# Patient Record
Sex: Female | Born: 1955 | Race: White | Hispanic: No | Marital: Single | Smoking: Never smoker
Health system: Southern US, Community
[De-identification: ages and names within clinical notes are randomized; demographics above are authoritative.]

## PROBLEM LIST (undated history)

## (undated) DIAGNOSIS — F419 Anxiety disorder, unspecified: Secondary | ICD-10-CM

## (undated) DIAGNOSIS — G43909 Migraine, unspecified, not intractable, without status migrainosus: Secondary | ICD-10-CM

## (undated) DIAGNOSIS — F32A Depression, unspecified: Secondary | ICD-10-CM

## (undated) DIAGNOSIS — R053 Chronic cough: Secondary | ICD-10-CM

## (undated) HISTORY — PX: MULTIPLE TOOTH EXTRACTIONS: SHX2053

## (undated) HISTORY — PX: BACK SURGERY: SHX140

## (undated) HISTORY — PX: WISDOM TOOTH EXTRACTION: SHX21

## (undated) HISTORY — PX: HIP ARTHROPLASTY: SHX981

---

## 2021-03-08 ENCOUNTER — Other Ambulatory Visit: Payer: Self-pay | Admitting: Neurosurgery

## 2021-03-16 ENCOUNTER — Other Ambulatory Visit: Payer: Self-pay | Admitting: Neurosurgery

## 2021-03-19 NOTE — Pre-Procedure Instructions (Signed)
Surgical Instructions    Your procedure is scheduled on Thursday, February 23rd.  Report to [facility] Main Entrance "A" at 5:30 A.M., then check in with the Admitting office.  Call this number if you have problems the morning of surgery:  [phone]   If you have any questions prior to your surgery date call [phone]: Open Monday-Friday 8am-4pm    Remember:  Do not eat or drink after midnight the night before your surgery   Take these medicines the morning of surgery with A SIP OF WATER  ARIPiprazole (ABILIFY)  FLUoxetine (PROZAC)  gabapentin (NEURONTIN)  oxyCODONE-acetaminophen (PERCOCET/ROXICET)  topiramate (TOPAMAX)   As needed: clonazePAM (KLONOPIN) cyclobenzaprine (FLEXERIL)   As of today, STOP taking any Aspirin (unless otherwise instructed by your surgeon) Aleve, Naproxen, Ibuprofen, Motrin, Advil, Goody's, BC's, all herbal medications, fish oil, and all vitamins.                     Do NOT Smoke (Tobacco/Vaping) for 24 hours prior to your procedure.  If you use a CPAP at night, you may bring your mask/headgear for your overnight stay.   Contacts, glasses, piercing's, hearing aid's, dentures or partials may not be worn into surgery, please bring cases for these belongings.    For patients admitted to the hospital, discharge time will be determined by your treatment team.   Patients discharged the day of surgery will not be allowed to drive home, and someone needs to stay with them for 24 hours.  NO VISITORS WILL BE ALLOWED IN PRE-OP WHERE PATIENTS ARE PREPPED FOR SURGERY.  ONLY 1 SUPPORT PERSON MAY BE PRESENT IN THE WAITING ROOM WHILE YOU ARE IN SURGERY.  IF YOU ARE TO BE ADMITTED, ONCE YOU ARE IN YOUR ROOM YOU WILL BE ALLOWED TWO (2) VISITORS. (1) VISITOR MAY STAY OVERNIGHT BUT MUST ARRIVE TO THE ROOM BY 8pm.  Minor children may have two parents present. Special consideration for safety and communication needs will be reviewed on a case by case basis.   Special  instructions:   [place]- Preparing For Surgery  Before surgery, you can play an important role. Because skin is not sterile, your skin needs to be as free of germs as possible. You can reduce the number of germs on your skin by washing with CHG (chlorahexidine gluconate) Soap before surgery.  CHG is an antiseptic cleaner which kills germs and bonds with the skin to continue killing germs even after washing.    Oral Hygiene is also important to reduce your risk of infection.  Remember - BRUSH YOUR TEETH THE MORNING OF SURGERY WITH YOUR REGULAR TOOTHPASTE  Please do not use if you have an allergy to CHG or antibacterial soaps. If your skin becomes reddened/irritated stop using the CHG.  Do not shave (including legs and underarms) for at least 48 hours prior to first CHG shower. It is OK to shave your face.  Please follow these instructions carefully.   Shower the NIGHT BEFORE SURGERY and the MORNING OF SURGERY  If you chose to wash your hair, wash your hair first as usual with your normal shampoo.  After you shampoo, rinse your hair and body thoroughly to remove the shampoo.  Use CHG Soap as you would any other liquid soap. You can apply CHG directly to the skin and wash gently with a scrungie or a clean washcloth.   Apply the CHG Soap to your body ONLY FROM THE NECK DOWN.  Do not use on open  wounds or open sores. Avoid contact with your eyes, ears, mouth and genitals (private parts). Wash Face and genitals (private parts)  with your normal soap.   Wash thoroughly, paying special attention to the area where your surgery will be performed.  Thoroughly rinse your body with warm water from the neck down.  DO NOT shower/wash with your normal soap after using and rinsing off the CHG Soap.  Pat yourself dry with a CLEAN TOWEL.  Wear CLEAN PAJAMAS to bed the night before surgery  Place CLEAN SHEETS on your bed the night before your surgery  DO NOT SLEEP WITH PETS.   Day of  Surgery: Shower with CHG soap. Do not wear jewelry, make up, nail polish, gel polish, artificial nails, or any other type of covering on natural nails including finger and toenails. If patients have artificial nails, gel coating, etc. that need to be removed by a nail salon please have this removed prior to surgery. Surgery may need to be canceled/delayed if the surgeon/anesthesiologist feels like the patient is unable to be adequately monitored. Do not wear lotions, powders, perfumes, or deodorant. Do not shave 48 hours prior to surgery.  Do not bring valuables to the hospital. [facility] is not responsible for any belongings or valuables. Wear Clean/Comfortable clothing the morning of surgery Remember to brush your teeth WITH YOUR REGULAR TOOTHPASTE.   Please read over the following fact sheets that you were given.   3 days prior to your procedure or After your COVID test   You are not required to quarantine however you are required to wear a well-fitting mask when you are out and around people not in your household. If your mask becomes wet or soiled, replace with a new one.   Wash your hands often with soap and water for 20 seconds or clean your hands with an alcohol-based hand sanitizer that contains at least 60% alcohol.   Do not share personal items.   Notify your provider:  o if you are in close contact with someone who has COVID  o or if you develop a fever of 100.4 or greater, sneezing, cough, sore throat, shortness of breath or body aches.

## 2021-03-22 ENCOUNTER — Encounter (HOSPITAL_COMMUNITY): Payer: Self-pay

## 2021-03-22 ENCOUNTER — Encounter (HOSPITAL_COMMUNITY)
Admission: RE | Admit: 2021-03-22 | Discharge: 2021-03-22 | Disposition: A | Discharge status: Home / Self Care | Payer: Medicare PPO | Source: Ambulatory Visit | Attending: Neurosurgery | Admitting: Neurosurgery

## 2021-03-22 ENCOUNTER — Other Ambulatory Visit: Payer: Self-pay

## 2021-03-22 VITALS — BP 121/88 | HR 90 | Temp 98.5°F | Resp 17 | Ht 64.0 in | Wt 126.8 lb

## 2021-03-22 DIAGNOSIS — Z01812 Encounter for preprocedural laboratory examination: Secondary | ICD-10-CM | POA: Insufficient documentation

## 2021-03-22 DIAGNOSIS — Z20822 Contact with and (suspected) exposure to covid-19: Secondary | ICD-10-CM | POA: Insufficient documentation

## 2021-03-22 DIAGNOSIS — Z01818 Encounter for other preprocedural examination: Secondary | ICD-10-CM

## 2021-03-22 HISTORY — DX: Migraine, unspecified, not intractable, without status migrainosus: G43.909

## 2021-03-22 HISTORY — DX: Anxiety disorder, unspecified: F41.9

## 2021-03-22 HISTORY — DX: Depression, unspecified: F32.A

## 2021-03-22 HISTORY — DX: Chronic cough: R05.3

## 2021-03-22 LAB — SURGICAL PCR SCREEN
MRSA, PCR: NEGATIVE
Staphylococcus aureus: POSITIVE — AB

## 2021-03-22 LAB — CBC
HCT: 41.7 % (ref 36.0–46.0)
Hemoglobin: 13.5 g/dL (ref 12.0–15.0)
MCH: 31.3 pg (ref 26.0–34.0)
MCHC: 32.4 g/dL (ref 30.0–36.0)
MCV: 96.5 fL (ref 80.0–100.0)
Platelets: 334 10*3/uL (ref 150–400)
RBC: 4.32 MIL/uL (ref 3.87–5.11)
RDW: 13 % (ref 11.5–15.5)
WBC: 9.9 10*3/uL (ref 4.0–10.5)
nRBC: 0 % (ref 0.0–0.2)

## 2021-03-22 LAB — TYPE AND SCREEN
ABO/RH(D): AB POS
Antibody Screen: NEGATIVE

## 2021-03-22 NOTE — Progress Notes (Signed)
PCP - PA K. Lomax  Cardiologist - Denies  EP- Denies  Endocrine- Denies  Pulm- Denies  Chest x-ray - Denies  EKG - Denies  Stress Test - Denies  ECHO - Denies  Cardiac Cath - Denies  AICD- na PM- na LOOP- na  Nerve Stimulator- Denies  Dialysis- Denies  Sleep Study - Denies CPAP - Denies  LABS- 03/22/21: CBC, COVID, T/S, PCR  ASA- Denies  ERAS- No  HA1C- Denies  Anesthesia- No  Pt denies having chest pain, sob, or fever at this time. All instructions explained to the pt, with a verbal understanding of the material. Pt agrees to go over the instructions while at home for a better understanding. Pt also instructed to wear a mask and social distance if she goes out. after being tested for COVID-19. The opportunity to ask questions was provided.    Coronavirus Screening  Have you experienced the following symptoms:  Cough yes/no: No Fever (>100.49F)  yes/no: No Runny nose yes/no: No Sore throat yes/no: No Difficulty breathing/shortness of breath  yes/no: No  Have you or a family member traveled in the last 14 days and where? yes/no: No   If the patient indicates "YES" to the above questions, their PAT will be rescheduled to limit the exposure to others and, the surgeon will be notified. THE PATIENT WILL NEED TO BE ASYMPTOMATIC FOR 14 DAYS.   If the patient is not experiencing any of these symptoms, the PAT nurse will instruct them to NOT bring anyone with them to their appointment since they may have these symptoms or traveled as well.   Please remind your patients and families that hospital visitation restrictions are in effect and the importance of the restrictions.

## 2021-03-23 LAB — SARS CORONAVIRUS 2 (TAT 6-24 HRS): SARS Coronavirus 2: NEGATIVE

## 2021-03-24 NOTE — Anesthesia Preprocedure Evaluation (Addendum)
Anesthesia Evaluation  Patient identified by MRN, date of birth, ID band Patient awake    Reviewed: Allergy & Precautions, NPO status , Patient's Chart, lab work & pertinent test results  Airway Mallampati: II  TM Distance: >3 FB Neck ROM: Full    Dental  (+) Dental Advisory Given, Poor Dentition, Chipped, Missing,    Pulmonary neg pulmonary ROS, Patient abstained from smoking.,    Pulmonary exam normal breath sounds clear to auscultation       Cardiovascular negative cardio ROS Normal cardiovascular exam Rhythm:Regular Rate:Normal     Neuro/Psych  Headaches, PSYCHIATRIC DISORDERS Anxiety Depression    GI/Hepatic negative GI ROS, Neg liver ROS,   Endo/Other  negative endocrine ROS  Renal/GU negative Renal ROS     Musculoskeletal negative musculoskeletal ROS (+)   Abdominal   Peds  Hematology negative hematology ROS (+)   Anesthesia Other Findings   Reproductive/Obstetrics                            Anesthesia Physical Anesthesia Plan  ASA: 2  Anesthesia Plan: General   Post-op Pain Management: Tylenol PO (pre-op)*, Gabapentin PO (pre-op)* and Ketamine IV*   Induction:   PONV Risk Score and Plan: 4 or greater and Ondansetron, Dexamethasone, Treatment may vary due to age or medical condition, Midazolam and Propofol infusion  Airway Management Planned: Oral ETT  Additional Equipment: Arterial line  Intra-op Plan:   Post-operative Plan: Extubation in OR  Informed Consent: I have reviewed the patients History and Physical, chart, labs and discussed the procedure including the risks, benefits and alternatives for the proposed anesthesia with the patient or authorized representative who has indicated his/her understanding and acceptance.     Dental advisory given  Plan Discussed with: CRNA  Anesthesia Plan Comments: (2 x PIV)      Anesthesia Quick Evaluation

## 2021-03-25 ENCOUNTER — Encounter (HOSPITAL_COMMUNITY): Payer: Self-pay

## 2021-03-25 ENCOUNTER — Other Ambulatory Visit: Payer: Self-pay

## 2021-03-25 ENCOUNTER — Inpatient Hospital Stay (HOSPITAL_COMMUNITY)
Admission: RE | Disposition: A | Discharge status: Home / Self Care | Payer: Self-pay | Source: Home / Self Care | Attending: Neurosurgery

## 2021-03-25 ENCOUNTER — Inpatient Hospital Stay (HOSPITAL_COMMUNITY): Payer: Medicare PPO | Admitting: Anesthesiology

## 2021-03-25 ENCOUNTER — Inpatient Hospital Stay (HOSPITAL_COMMUNITY): Payer: Medicare PPO

## 2021-03-25 ENCOUNTER — Inpatient Hospital Stay (HOSPITAL_COMMUNITY)
Admission: RE | Admit: 2021-03-25 | Discharge: 2021-03-27 | DRG: 455 | Disposition: A | Discharge status: Home / Self Care | Payer: Medicare PPO | Attending: Neurosurgery | Admitting: Neurosurgery

## 2021-03-25 DIAGNOSIS — Z419 Encounter for procedure for purposes other than remedying health state, unspecified: Secondary | ICD-10-CM

## 2021-03-25 DIAGNOSIS — F419 Anxiety disorder, unspecified: Secondary | ICD-10-CM | POA: Diagnosis present

## 2021-03-25 DIAGNOSIS — M48061 Spinal stenosis, lumbar region without neurogenic claudication: Principal | ICD-10-CM | POA: Diagnosis present

## 2021-03-25 DIAGNOSIS — M5136 Other intervertebral disc degeneration, lumbar region: Secondary | ICD-10-CM | POA: Diagnosis present

## 2021-03-25 DIAGNOSIS — F32A Depression, unspecified: Secondary | ICD-10-CM | POA: Diagnosis present

## 2021-03-25 DIAGNOSIS — Z79899 Other long term (current) drug therapy: Secondary | ICD-10-CM

## 2021-03-25 DIAGNOSIS — M532X6 Spinal instabilities, lumbar region: Secondary | ICD-10-CM | POA: Diagnosis present

## 2021-03-25 DIAGNOSIS — M419 Scoliosis, unspecified: Secondary | ICD-10-CM | POA: Diagnosis present

## 2021-03-25 DIAGNOSIS — M4316 Spondylolisthesis, lumbar region: Secondary | ICD-10-CM | POA: Diagnosis present

## 2021-03-25 DIAGNOSIS — Z20822 Contact with and (suspected) exposure to covid-19: Secondary | ICD-10-CM | POA: Diagnosis present

## 2021-03-25 DIAGNOSIS — Z96642 Presence of left artificial hip joint: Secondary | ICD-10-CM | POA: Diagnosis present

## 2021-03-25 DIAGNOSIS — Z79891 Long term (current) use of opiate analgesic: Secondary | ICD-10-CM | POA: Diagnosis not present

## 2021-03-25 DIAGNOSIS — Z981 Arthrodesis status: Secondary | ICD-10-CM | POA: Diagnosis not present

## 2021-03-25 DIAGNOSIS — M438X6 Other specified deforming dorsopathies, lumbar region: Secondary | ICD-10-CM | POA: Diagnosis present

## 2021-03-25 DIAGNOSIS — M4156 Other secondary scoliosis, lumbar region: Secondary | ICD-10-CM | POA: Diagnosis present

## 2021-03-25 HISTORY — PX: ANTERIOR LAT LUMBAR FUSION: SHX1168

## 2021-03-25 HISTORY — PX: LAMINECTOMY WITH POSTERIOR LATERAL ARTHRODESIS LEVEL 3: SHX6337

## 2021-03-25 LAB — ABO/RH: ABO/RH(D): AB POS

## 2021-03-25 SURGERY — ANTERIOR LATERAL LUMBAR FUSION 3 LEVELS
Anesthesia: General

## 2021-03-25 MED ORDER — MENTHOL 3 MG MT LOZG: 1.0000 | LOZENGE | OROMUCOSAL | Status: DC | PRN

## 2021-03-25 MED ORDER — DEXMEDETOMIDINE (PRECEDEX) IN NS 20 MCG/5ML (4 MCG/ML) IV SYRINGE
PREFILLED_SYRINGE | INTRAVENOUS | Status: DC | PRN
  Administered 2021-03-25: 8 ug via INTRAVENOUS
  Administered 2021-03-25 (×3): 4 ug via INTRAVENOUS

## 2021-03-25 MED ORDER — PROPOFOL 10 MG/ML IV BOLUS
INTRAVENOUS | Status: DC | PRN
  Administered 2021-03-25: 100 mg via INTRAVENOUS
  Administered 2021-03-25: 50 mg via INTRAVENOUS
  Administered 2021-03-25: 35 mg via INTRAVENOUS
  Administered 2021-03-25 (×4): 50 mg via INTRAVENOUS
  Administered 2021-03-25: 40 mg via INTRAVENOUS
  Administered 2021-03-25 (×4): 50 mg via INTRAVENOUS
  Administered 2021-03-25: 100 mg via INTRAVENOUS

## 2021-03-25 MED ORDER — DEXMEDETOMIDINE (PRECEDEX) IN NS 20 MCG/5ML (4 MCG/ML) IV SYRINGE
PREFILLED_SYRINGE | INTRAVENOUS | Status: AC
  Filled 2021-03-25: qty 5

## 2021-03-25 MED ORDER — ONDANSETRON HCL 4 MG/2ML IJ SOLN
INTRAMUSCULAR | Status: DC | PRN
  Administered 2021-03-25: 4 mg via INTRAVENOUS

## 2021-03-25 MED ORDER — OXYCODONE HCL 5 MG PO TABS
ORAL_TABLET | ORAL | Status: AC
  Filled 2021-03-25: qty 1

## 2021-03-25 MED ORDER — PSEUDOEPHEDRINE HCL ER 120 MG PO TB12: 120.0000 mg | ORAL_TABLET | Freq: Every day | ORAL | Status: DC | PRN

## 2021-03-25 MED ORDER — SODIUM CHLORIDE (PF) 0.9 % IJ SOLN
INTRAMUSCULAR | Status: DC | PRN
  Administered 2021-03-25: 10 mL via INTRAVENOUS

## 2021-03-25 MED ORDER — POLYETHYLENE GLYCOL 3350 17 G PO PACK: 17.0000 g | PACK | Freq: Every day | ORAL | Status: DC | PRN

## 2021-03-25 MED ORDER — POTASSIUM CHLORIDE IN NACL 20-0.9 MEQ/L-% IV SOLN: INTRAVENOUS | Status: DC

## 2021-03-25 MED ORDER — OXYCODONE HCL 5 MG PO TABS
10.0000 mg | ORAL_TABLET | ORAL | Status: DC | PRN
  Administered 2021-03-25 – 2021-03-26 (×5): 10 mg via ORAL
  Filled 2021-03-25 (×5): qty 2

## 2021-03-25 MED ORDER — VANCOMYCIN HCL 1000 MG IV SOLR
INTRAVENOUS | Status: DC | PRN
  Administered 2021-03-25: 1000 mg

## 2021-03-25 MED ORDER — GABAPENTIN 300 MG PO CAPS
300.0000 mg | ORAL_CAPSULE | Freq: Two times a day (BID) | ORAL | Status: DC
  Administered 2021-03-25 – 2021-03-27 (×4): 300 mg via ORAL
  Filled 2021-03-25 (×4): qty 1

## 2021-03-25 MED ORDER — OXYCODONE HCL 5 MG PO TABS
5.0000 mg | ORAL_TABLET | ORAL | Status: DC | PRN
  Administered 2021-03-25: 5 mg via ORAL

## 2021-03-25 MED ORDER — ROCURONIUM BROMIDE 10 MG/ML (PF) SYRINGE
PREFILLED_SYRINGE | INTRAVENOUS | Status: AC
  Filled 2021-03-25: qty 10

## 2021-03-25 MED ORDER — ACETAMINOPHEN 650 MG RE SUPP: 650.0000 mg | RECTAL | Status: DC | PRN

## 2021-03-25 MED ORDER — CEFAZOLIN SODIUM-DEXTROSE 2-4 GM/100ML-% IV SOLN
2.0000 g | INTRAVENOUS | Status: AC
  Administered 2021-03-25 (×2): 2 g via INTRAVENOUS
  Filled 2021-03-25: qty 100

## 2021-03-25 MED ORDER — EPHEDRINE 5 MG/ML INJ
INTRAVENOUS | Status: AC
  Filled 2021-03-25: qty 5

## 2021-03-25 MED ORDER — HYDROMORPHONE HCL 1 MG/ML IJ SOLN
INTRAMUSCULAR | Status: AC
  Filled 2021-03-25: qty 1

## 2021-03-25 MED ORDER — VITAMIN B-12 1000 MCG PO TABS
1000.0000 ug | ORAL_TABLET | Freq: Every day | ORAL | Status: DC
  Administered 2021-03-25 – 2021-03-27 (×2): 1000 ug via ORAL
  Filled 2021-03-25 (×2): qty 1

## 2021-03-25 MED ORDER — BUPIVACAINE HCL (PF) 0.5 % IJ SOLN
INTRAMUSCULAR | Status: DC | PRN
  Administered 2021-03-25: 5 mL
  Administered 2021-03-25: 20 mL
  Administered 2021-03-25: 5 mL

## 2021-03-25 MED ORDER — KETAMINE HCL 10 MG/ML IJ SOLN
INTRAMUSCULAR | Status: DC | PRN
  Administered 2021-03-25: 40 mg via INTRAVENOUS
  Administered 2021-03-25: 10 mg via INTRAVENOUS

## 2021-03-25 MED ORDER — LACTATED RINGERS IV SOLN: INTRAVENOUS | Status: DC | PRN

## 2021-03-25 MED ORDER — LIDOCAINE 2% (20 MG/ML) 5 ML SYRINGE
INTRAMUSCULAR | Status: AC
  Filled 2021-03-25: qty 5

## 2021-03-25 MED ORDER — ALBUMIN HUMAN 5 % IV SOLN: INTRAVENOUS | Status: DC | PRN

## 2021-03-25 MED ORDER — ACETAMINOPHEN 325 MG PO TABS
650.0000 mg | ORAL_TABLET | ORAL | Status: DC | PRN
  Administered 2021-03-25 – 2021-03-27 (×6): 650 mg via ORAL
  Filled 2021-03-25 (×6): qty 2

## 2021-03-25 MED ORDER — SODIUM CHLORIDE 0.9% FLUSH: 3.0000 mL | INTRAVENOUS | Status: DC | PRN

## 2021-03-25 MED ORDER — BUPIVACAINE HCL (PF) 0.5 % IJ SOLN
INTRAMUSCULAR | Status: AC
  Filled 2021-03-25: qty 30

## 2021-03-25 MED ORDER — BUPIVACAINE LIPOSOME 1.3 % IJ SUSP
INTRAMUSCULAR | Status: AC
  Filled 2021-03-25: qty 20

## 2021-03-25 MED ORDER — PROPOFOL 500 MG/50ML IV EMUL
INTRAVENOUS | Status: DC | PRN
  Administered 2021-03-25: 25 ug/kg/min via INTRAVENOUS
  Administered 2021-03-25: 50 ug/kg/min via INTRAVENOUS
  Administered 2021-03-25: 150 ug/kg/min via INTRAVENOUS

## 2021-03-25 MED ORDER — ONDANSETRON HCL 4 MG/2ML IJ SOLN
INTRAMUSCULAR | Status: AC
  Filled 2021-03-25: qty 2

## 2021-03-25 MED ORDER — ROCURONIUM BROMIDE 100 MG/10ML IV SOLN
INTRAVENOUS | Status: DC | PRN
  Administered 2021-03-25: 30 mg via INTRAVENOUS
  Administered 2021-03-25: 50 mg via INTRAVENOUS
  Administered 2021-03-25: 20 mg via INTRAVENOUS

## 2021-03-25 MED ORDER — ORAL CARE MOUTH RINSE: 15.0000 mL | Freq: Once | OROMUCOSAL | Status: AC

## 2021-03-25 MED ORDER — THROMBIN 5000 UNITS EX SOLR
CUTANEOUS | Status: AC
  Filled 2021-03-25: qty 5000

## 2021-03-25 MED ORDER — MIDAZOLAM HCL 2 MG/2ML IJ SOLN
INTRAMUSCULAR | Status: DC | PRN
  Administered 2021-03-25 (×2): 1 mg via INTRAVENOUS

## 2021-03-25 MED ORDER — ACETAMINOPHEN 500 MG PO TABS
1000.0000 mg | ORAL_TABLET | Freq: Once | ORAL | Status: AC
  Administered 2021-03-25: 1000 mg via ORAL
  Filled 2021-03-25: qty 2

## 2021-03-25 MED ORDER — CEFAZOLIN SODIUM-DEXTROSE 1-4 GM/50ML-% IV SOLN
1.0000 g | Freq: Three times a day (TID) | INTRAVENOUS | Status: DC
  Administered 2021-03-25 – 2021-03-26 (×2): 1 g via INTRAVENOUS
  Filled 2021-03-25: qty 50

## 2021-03-25 MED ORDER — HYDROMORPHONE HCL 1 MG/ML IJ SOLN
0.2500 mg | INTRAMUSCULAR | Status: DC | PRN
  Administered 2021-03-25 (×2): 0.5 mg via INTRAVENOUS
  Administered 2021-03-25: 0.25 mg via INTRAVENOUS
  Administered 2021-03-25: 0.5 mg via INTRAVENOUS
  Administered 2021-03-25: 0.25 mg via INTRAVENOUS

## 2021-03-25 MED ORDER — OYSTER SHELL CALCIUM/D3 500-5 MG-MCG PO TABS
ORAL_TABLET | Freq: Every day | ORAL | Status: DC
  Administered 2021-03-25 – 2021-03-27 (×2): 1 via ORAL
  Filled 2021-03-25 (×2): qty 1

## 2021-03-25 MED ORDER — 0.9 % SODIUM CHLORIDE (POUR BTL) OPTIME
TOPICAL | Status: DC | PRN
  Administered 2021-03-25 (×2): 1000 mL

## 2021-03-25 MED ORDER — FLUOXETINE HCL 20 MG PO CAPS
40.0000 mg | ORAL_CAPSULE | Freq: Every day | ORAL | Status: DC
  Administered 2021-03-25: 40 mg via ORAL
  Filled 2021-03-25 (×2): qty 2

## 2021-03-25 MED ORDER — DEXAMETHASONE SODIUM PHOSPHATE 10 MG/ML IJ SOLN
INTRAMUSCULAR | Status: AC
  Filled 2021-03-25: qty 1

## 2021-03-25 MED ORDER — MEPERIDINE HCL 25 MG/ML IJ SOLN: 6.2500 mg | INTRAMUSCULAR | Status: DC | PRN

## 2021-03-25 MED ORDER — POTASSIUM 99 MG PO TABS: 99.0000 mg | ORAL_TABLET | Freq: Every day | ORAL | Status: DC

## 2021-03-25 MED ORDER — LIDOCAINE-EPINEPHRINE 1 %-1:100000 IJ SOLN
INTRAMUSCULAR | Status: AC
  Filled 2021-03-25: qty 1

## 2021-03-25 MED ORDER — ENOXAPARIN SODIUM 40 MG/0.4ML IJ SOSY
40.0000 mg | PREFILLED_SYRINGE | INTRAMUSCULAR | Status: DC
  Administered 2021-03-26 – 2021-03-27 (×2): 40 mg via SUBCUTANEOUS
  Filled 2021-03-25 (×2): qty 0.4

## 2021-03-25 MED ORDER — CLONAZEPAM 0.5 MG PO TABS
0.5000 mg | ORAL_TABLET | Freq: Two times a day (BID) | ORAL | Status: DC | PRN
  Administered 2021-03-25 – 2021-03-26 (×2): 0.5 mg via ORAL
  Filled 2021-03-25 (×2): qty 1

## 2021-03-25 MED ORDER — ONDANSETRON HCL 4 MG PO TABS: 4.0000 mg | ORAL_TABLET | Freq: Four times a day (QID) | ORAL | Status: DC | PRN

## 2021-03-25 MED ORDER — BUPIVACAINE LIPOSOME 1.3 % IJ SUSP
INTRAMUSCULAR | Status: DC | PRN
  Administered 2021-03-25: 20 mL

## 2021-03-25 MED ORDER — HYDROMORPHONE HCL 1 MG/ML IJ SOLN
INTRAMUSCULAR | Status: DC | PRN
Start: 2021-03-25 — End: 2021-03-25
  Administered 2021-03-25: .5 mg via INTRAVENOUS

## 2021-03-25 MED ORDER — LACTATED RINGERS IV SOLN: INTRAVENOUS | Status: DC

## 2021-03-25 MED ORDER — PROMETHAZINE HCL 25 MG/ML IJ SOLN: 6.2500 mg | INTRAMUSCULAR | Status: DC | PRN

## 2021-03-25 MED ORDER — CHLORHEXIDINE GLUCONATE 0.12 % MT SOLN
15.0000 mL | Freq: Once | OROMUCOSAL | Status: AC
  Administered 2021-03-25: 15 mL via OROMUCOSAL
  Filled 2021-03-25: qty 15

## 2021-03-25 MED ORDER — PROPOFOL 1000 MG/100ML IV EMUL
INTRAVENOUS | Status: AC
  Filled 2021-03-25: qty 100

## 2021-03-25 MED ORDER — FENTANYL CITRATE (PF) 250 MCG/5ML IJ SOLN
INTRAMUSCULAR | Status: AC
  Filled 2021-03-25: qty 5

## 2021-03-25 MED ORDER — PROPOFOL 1000 MG/100ML IV EMUL
INTRAVENOUS | Status: AC
  Filled 2021-03-25: qty 300

## 2021-03-25 MED ORDER — EPHEDRINE SULFATE-NACL 50-0.9 MG/10ML-% IV SOSY
PREFILLED_SYRINGE | INTRAVENOUS | Status: DC | PRN
  Administered 2021-03-25: 10 mg via INTRAVENOUS

## 2021-03-25 MED ORDER — FENTANYL CITRATE (PF) 250 MCG/5ML IJ SOLN
INTRAMUSCULAR | Status: DC | PRN
  Administered 2021-03-25: 50 ug via INTRAVENOUS
  Administered 2021-03-25: 100 ug via INTRAVENOUS
  Administered 2021-03-25 (×2): 50 ug via INTRAVENOUS

## 2021-03-25 MED ORDER — SUCCINYLCHOLINE CHLORIDE 200 MG/10ML IV SOSY
PREFILLED_SYRINGE | INTRAVENOUS | Status: DC | PRN
  Administered 2021-03-25: 80 mg via INTRAVENOUS

## 2021-03-25 MED ORDER — DEXAMETHASONE SODIUM PHOSPHATE 10 MG/ML IJ SOLN
INTRAMUSCULAR | Status: DC | PRN
Start: 2021-03-25 — End: 2021-03-25
  Administered 2021-03-25: 5 mg via INTRAVENOUS

## 2021-03-25 MED ORDER — KETAMINE HCL 50 MG/5ML IJ SOSY
PREFILLED_SYRINGE | INTRAMUSCULAR | Status: AC
  Filled 2021-03-25: qty 5

## 2021-03-25 MED ORDER — SODIUM CHLORIDE 0.9% FLUSH
3.0000 mL | Freq: Two times a day (BID) | INTRAVENOUS | Status: DC
  Administered 2021-03-26: 3 mL via INTRAVENOUS

## 2021-03-25 MED ORDER — VANCOMYCIN HCL 1000 MG IV SOLR
INTRAVENOUS | Status: AC
  Filled 2021-03-25: qty 20

## 2021-03-25 MED ORDER — FLEET ENEMA 7-19 GM/118ML RE ENEM: 1.0000 | ENEMA | Freq: Once | RECTAL | Status: DC | PRN

## 2021-03-25 MED ORDER — THROMBIN 5000 UNITS EX SOLR
OROMUCOSAL | Status: DC | PRN
  Administered 2021-03-25 (×2): 5 mL via TOPICAL

## 2021-03-25 MED ORDER — CHLORHEXIDINE GLUCONATE CLOTH 2 % EX PADS: 6.0000 | MEDICATED_PAD | Freq: Once | CUTANEOUS | Status: DC

## 2021-03-25 MED ORDER — ARIPIPRAZOLE 5 MG PO TABS
5.0000 mg | ORAL_TABLET | Freq: Every day | ORAL | Status: DC
  Administered 2021-03-26 – 2021-03-27 (×2): 5 mg via ORAL
  Filled 2021-03-25 (×2): qty 1

## 2021-03-25 MED ORDER — DOCUSATE SODIUM 100 MG PO CAPS
100.0000 mg | ORAL_CAPSULE | Freq: Two times a day (BID) | ORAL | Status: DC
  Administered 2021-03-25 – 2021-03-27 (×4): 100 mg via ORAL
  Filled 2021-03-25 (×4): qty 1

## 2021-03-25 MED ORDER — PROPOFOL 10 MG/ML IV BOLUS
INTRAVENOUS | Status: AC
  Filled 2021-03-25: qty 20

## 2021-03-25 MED ORDER — SUCCINYLCHOLINE CHLORIDE 200 MG/10ML IV SOSY
PREFILLED_SYRINGE | INTRAVENOUS | Status: AC
  Filled 2021-03-25: qty 10

## 2021-03-25 MED ORDER — ONDANSETRON HCL 4 MG/2ML IJ SOLN: 4.0000 mg | Freq: Four times a day (QID) | INTRAMUSCULAR | Status: DC | PRN

## 2021-03-25 MED ORDER — PHENYLEPHRINE 40 MCG/ML (10ML) SYRINGE FOR IV PUSH (FOR BLOOD PRESSURE SUPPORT)
PREFILLED_SYRINGE | INTRAVENOUS | Status: DC | PRN
  Administered 2021-03-25 (×3): 40 ug via INTRAVENOUS

## 2021-03-25 MED ORDER — LIDOCAINE-EPINEPHRINE 1 %-1:100000 IJ SOLN
INTRAMUSCULAR | Status: DC | PRN
  Administered 2021-03-25 (×2): 5 mL

## 2021-03-25 MED ORDER — MAGNESIUM GLUCONATE 500 MG PO TABS
500.0000 mg | ORAL_TABLET | Freq: Every day | ORAL | Status: DC
Start: 2021-03-25 — End: 2021-03-27
  Administered 2021-03-26: 500 mg via ORAL
  Filled 2021-03-25 (×3): qty 1

## 2021-03-25 MED ORDER — FERROUS SULFATE 325 (65 FE) MG PO TABS
325.0000 mg | ORAL_TABLET | Freq: Every day | ORAL | Status: DC
  Administered 2021-03-26 – 2021-03-27 (×2): 325 mg via ORAL
  Filled 2021-03-25 (×2): qty 1

## 2021-03-25 MED ORDER — PHENYLEPHRINE HCL-NACL 20-0.9 MG/250ML-% IV SOLN
INTRAVENOUS | Status: DC | PRN
  Administered 2021-03-25: 50 ug/min via INTRAVENOUS

## 2021-03-25 MED ORDER — LIDOCAINE 2% (20 MG/ML) 5 ML SYRINGE
INTRAMUSCULAR | Status: DC | PRN
Start: 2021-03-25 — End: 2021-03-25
  Administered 2021-03-25: 60 mg via INTRAVENOUS

## 2021-03-25 MED ORDER — FLUOXETINE HCL 20 MG PO CAPS
20.0000 mg | ORAL_CAPSULE | Freq: Every morning | ORAL | Status: DC
  Administered 2021-03-26 – 2021-03-27 (×2): 20 mg via ORAL
  Filled 2021-03-25 (×2): qty 1

## 2021-03-25 MED ORDER — SODIUM CHLORIDE 0.9 % IV SOLN: 250.0000 mL | INTRAVENOUS | Status: DC

## 2021-03-25 MED ORDER — TOPIRAMATE 25 MG PO TABS
50.0000 mg | ORAL_TABLET | Freq: Two times a day (BID) | ORAL | Status: DC
  Administered 2021-03-25 – 2021-03-27 (×4): 50 mg via ORAL
  Filled 2021-03-25 (×4): qty 2

## 2021-03-25 MED ORDER — MIDAZOLAM HCL 2 MG/2ML IJ SOLN
INTRAMUSCULAR | Status: AC
  Filled 2021-03-25: qty 2

## 2021-03-25 MED ORDER — HYDROMORPHONE HCL 1 MG/ML IJ SOLN
INTRAMUSCULAR | Status: AC
  Filled 2021-03-25: qty 0.5

## 2021-03-25 MED ORDER — BISACODYL 5 MG PO TBEC: 5.0000 mg | DELAYED_RELEASE_TABLET | Freq: Every day | ORAL | Status: DC | PRN

## 2021-03-25 MED ORDER — PHENOL 1.4 % MT LIQD: 1.0000 | OROMUCOSAL | Status: DC | PRN

## 2021-03-25 MED ORDER — SUGAMMADEX SODIUM 200 MG/2ML IV SOLN
INTRAVENOUS | Status: DC | PRN
  Administered 2021-03-25: 200 mg via INTRAVENOUS

## 2021-03-25 MED ORDER — CYCLOBENZAPRINE HCL 10 MG PO TABS
10.0000 mg | ORAL_TABLET | Freq: Three times a day (TID) | ORAL | Status: DC | PRN
  Filled 2021-03-25: qty 1

## 2021-03-25 MED ORDER — PHENYLEPHRINE 40 MCG/ML (10ML) SYRINGE FOR IV PUSH (FOR BLOOD PRESSURE SUPPORT)
PREFILLED_SYRINGE | INTRAVENOUS | Status: AC
  Filled 2021-03-25: qty 10

## 2021-03-25 MED ORDER — HYDROMORPHONE HCL 1 MG/ML IJ SOLN
0.5000 mg | INTRAMUSCULAR | Status: DC | PRN
  Administered 2021-03-25 (×2): 0.5 mg via INTRAVENOUS
  Filled 2021-03-25 (×2): qty 0.5

## 2021-03-25 MED ORDER — GABAPENTIN 300 MG PO CAPS
300.0000 mg | ORAL_CAPSULE | Freq: Once | ORAL | Status: DC
Start: 2021-03-25 — End: 2021-03-25
  Filled 2021-03-25: qty 1

## 2021-03-25 SURGICAL SUPPLY — 95 items
BAG COUNTER SPONGE SURGICOUNT (BAG) ×6 IMPLANT
BAND RUBBER #18 3X1/16 STRL (MISCELLANEOUS) ×2 IMPLANT
BLADE CLIPPER SURG (BLADE) IMPLANT
BLADE EXTENDER LTP N26 DISP (ORTHOPEDIC DISPOSABLE SUPPLIES) ×1 IMPLANT
BUR CARBIDE MATCH 3.0 (BURR) ×1 IMPLANT
BUR PRECISION FLUTE 5.0 (BURR) ×1 IMPLANT
CANISTER SUCT 3000ML PPV (MISCELLANEOUS) ×2 IMPLANT
CAP PUSHER PTP (ORTHOPEDIC DISPOSABLE SUPPLIES) ×1 IMPLANT
CLIP SPRING STIM LLIF SAFEOP (CLIP) ×1 IMPLANT
DECANTER SPIKE VIAL GLASS SM (MISCELLANEOUS) ×2 IMPLANT
DERMABOND ADHESIVE PROPEN (GAUZE/BANDAGES/DRESSINGS) ×1
DERMABOND ADVANCED (GAUZE/BANDAGES/DRESSINGS) ×1
DERMABOND ADVANCED .7 DNX12 (GAUZE/BANDAGES/DRESSINGS) ×1 IMPLANT
DERMABOND ADVANCED .7 DNX6 (GAUZE/BANDAGES/DRESSINGS) IMPLANT
DILATOR INSULATED SAFEOP OVAL (NEUROSURGERY SUPPLIES) ×1 IMPLANT
DISSECTOR BLUNT TIP ENDO 5MM (MISCELLANEOUS) IMPLANT
DRAIN JACKSON PRATT 10MM FLAT (MISCELLANEOUS) ×2 IMPLANT
DRAPE C-ARM 42X72 X-RAY (DRAPES) ×5 IMPLANT
DRAPE C-ARMOR (DRAPES) ×3 IMPLANT
DRAPE LAPAROTOMY 100X72X124 (DRAPES) ×4 IMPLANT
DRAPE SURG 17X23 STRL (DRAPES) ×1 IMPLANT
DRSG OPSITE POSTOP 4X10 (GAUZE/BANDAGES/DRESSINGS) ×1 IMPLANT
DRSG OPSITE POSTOP 4X6 (GAUZE/BANDAGES/DRESSINGS) ×1 IMPLANT
DURAPREP 26ML APPLICATOR (WOUND CARE) ×4 IMPLANT
ELECT BLADE INSULATED 6.5IN (ELECTROSURGICAL) ×4
ELECT KIT SAFEOP SSEP/SURF (KITS) ×2
ELECT REM PT RETURN 9FT ADLT (ELECTROSURGICAL) ×4
ELECTRODE BLDE INSULATED 6.5IN (ELECTROSURGICAL) ×1 IMPLANT
ELECTRODE KT SAFEOP SSEP/SURF (KITS) IMPLANT
ELECTRODE REM PT RTRN 9FT ADLT (ELECTROSURGICAL) ×2 IMPLANT
EVACUATOR SILICONE 100CC (DRAIN) ×2 IMPLANT
GAUZE 4X4 16PLY ~~LOC~~+RFID DBL (SPONGE) ×3 IMPLANT
GAUZE SPONGE 4X4 12PLY STRL (GAUZE/BANDAGES/DRESSINGS) ×2 IMPLANT
GLOVE EXAM NITRILE XL STR (GLOVE) IMPLANT
GLOVE SURG LTX SZ7.5 (GLOVE) ×5 IMPLANT
GLOVE SURG UNDER POLY LF SZ7 (GLOVE) ×5 IMPLANT
GLOVE SURG UNDER POLY LF SZ7.5 (GLOVE) ×6 IMPLANT
GOWN STRL REUS W/ TWL LRG LVL3 (GOWN DISPOSABLE) ×2 IMPLANT
GOWN STRL REUS W/ TWL XL LVL3 (GOWN DISPOSABLE) ×3 IMPLANT
GOWN STRL REUS W/TWL 2XL LVL3 (GOWN DISPOSABLE) IMPLANT
GOWN STRL REUS W/TWL LRG LVL3 (GOWN DISPOSABLE) ×4
GOWN STRL REUS W/TWL XL LVL3 (GOWN DISPOSABLE) ×5
GRAFT BN 10X1XDBM MAGNIFUSE (Bone Implant) IMPLANT
GRAFT BONE MAGNIFUSE 1X10CM (Bone Implant) ×1 IMPLANT
GUIDEWIRE LLIF TT 320 (WIRE) ×1 IMPLANT
HANDLE T POWER 1/4 SQUARE (MISCELLANEOUS) ×1 IMPLANT
HEMOSTAT POWDER KIT SURGIFOAM (HEMOSTASIS) ×3 IMPLANT
KIT BASIN OR (CUSTOM PROCEDURE TRAY) ×4 IMPLANT
KIT INFUSE MEDIUM (Orthopedic Implant) ×1 IMPLANT
KIT TURNOVER KIT B (KITS) ×4 IMPLANT
KNIFE ANNULOTOMY GREY RETRACT (ORTHOPEDIC DISPOSABLE SUPPLIES) ×1 IMPLANT
LIF ILLUMINATION SYSTEM STERIL (SYSTAGENIX WOUND MANAGEMENT) ×2
MATRIX SPINE STRIP NEOCORE 5CC (Putty) IMPLANT
MATRIX STRIP NEOCORE 12C (Putty) IMPLANT
NDL HYPO 18GX1.5 BLUNT FILL (NEEDLE) IMPLANT
NDL HYPO 21X1.5 SAFETY (NEEDLE) IMPLANT
NEEDLE HYPO 18GX1.5 BLUNT FILL (NEEDLE) IMPLANT
NEEDLE HYPO 21X1.5 SAFETY (NEEDLE) ×2 IMPLANT
NEEDLE HYPO 22GX1.5 SAFETY (NEEDLE) ×4 IMPLANT
NS IRRIG 1000ML POUR BTL (IV SOLUTION) ×4 IMPLANT
PACK LAMINECTOMY NEURO (CUSTOM PROCEDURE TRAY) ×4 IMPLANT
PAD ARMBOARD 7.5X6 YLW CONV (MISCELLANEOUS) ×6 IMPLANT
PROBE BALL TIP LLIF SAFEOP (NEUROSURGERY SUPPLIES) ×1 IMPLANT
PUTTY DBF 6CC CORTICAL FIBERS (Putty) ×1 IMPLANT
ROD KODIAK 5.5X300 (Rod) ×2 IMPLANT
SCREW CANC SHANK MOD 5X50 (Screw) ×1 IMPLANT
SCREW CORT PA 5.5X50 (Screw) ×3 IMPLANT
SCREW PA CANC COR 7.5X55 (Screw) ×2 IMPLANT
SCREW PA CANC COR 8.5X45 (Screw) ×2 IMPLANT
SCREW PA CANC COR 8.5X55 (Screw) ×4 IMPLANT
SCREW POLYAXIAL TULIP (Screw) ×1 IMPLANT
SET SCREW (Screw) ×12 IMPLANT
SET SCREW SPNE (Screw) IMPLANT
SHIM INTRADISCAL LTP N DISP (ORTHOPEDIC DISPOSABLE SUPPLIES) ×1 IMPLANT
SPACER IDENT 8X18X50 0D (Spacer) IMPLANT
SPACER IDENTITI 6X22X50 10D (Spacer) ×1 IMPLANT
SPACER IDENTITI 8X18X50 0D (Spacer) ×1 IMPLANT
SPACER IDENTITI 8X22X50 0D (Spacer) ×1 IMPLANT
SPONGE SURGIFOAM ABS GEL SZ50 (HEMOSTASIS) IMPLANT
SPONGE T-LAP 4X18 ~~LOC~~+RFID (SPONGE) ×2 IMPLANT
SPONGE TONSIL TAPE 1 RFD (DISPOSABLE) IMPLANT
STAPLER VISISTAT 35W (STAPLE) ×2 IMPLANT
STRIP MATRIX NEOCORE 12CC (Putty) ×1 IMPLANT
STRIP MATRIX NEOCORE 5CC (Putty) ×1 IMPLANT
SUT MNCRL AB 4-0 PS2 18 (SUTURE) ×4 IMPLANT
SUT VIC AB 0 CT1 18XCR BRD8 (SUTURE) ×1 IMPLANT
SUT VIC AB 0 CT1 8-18 (SUTURE) ×5
SUT VIC AB 2-0 CP2 18 (SUTURE) ×7 IMPLANT
SYR 20CC LL (SYRINGE) ×1 IMPLANT
SYSTEM ILLUMINATION LIF STERIL (SYSTAGENIX WOUND MANAGEMENT) IMPLANT
TOWEL GREEN STERILE (TOWEL DISPOSABLE) ×4 IMPLANT
TOWEL GREEN STERILE FF (TOWEL DISPOSABLE) ×4 IMPLANT
TRAY FOLEY MTR SLVR 14FR STAT (SET/KITS/TRAYS/PACK) ×1 IMPLANT
TRAY FOLEY MTR SLVR 16FR STAT (SET/KITS/TRAYS/PACK) ×1 IMPLANT
WATER STERILE IRR 1000ML POUR (IV SOLUTION) ×4 IMPLANT

## 2021-03-25 NOTE — Transfer of Care (Signed)
Immediate Anesthesia Transfer of Care Note  Patient: Linda Haley  Procedure(s) Performed: Direct Lateral Interbody Fusion  Lumbar one-two, Lumbar two-three, Lumbar three-four POSTERIOR LATERAL FUSION W/CONNECTION OF INSTRUMENTATION TO PREVIOUS LUMBAR FOUR-,SACRAL ONE, LUMBAR THREE-FOUR POSTERIOR DECOMPRESSION  Patient Location: PACU  Anesthesia Type:General  Level of Consciousness: drowsy  Airway & Oxygen Therapy: Patient Spontanous Breathing  Post-op Assessment: Report given to RN and Post -op Vital signs reviewed and stable  Post vital signs: Reviewed and stable  Last Vitals:  Vitals Value Taken Time  BP 147/85 03/25/21 1533  Temp    Pulse 86 03/25/21 1539  Resp 21 03/25/21 1539  SpO2 100 % 03/25/21 1539  Vitals shown include unvalidated device data.  Last Pain:  Vitals:   03/25/21 0611  TempSrc:   PainSc: 2          Complications: No notable events documented.

## 2021-03-25 NOTE — Op Note (Signed)
Procedure(s): Direct Lateral Interbody Fusion  Lumbar one-two, Lumbar two-three, Lumbar three-four POSTERIOR LATERAL FUSION W/CONNECTION OF INSTRUMENTATION TO PREVIOUS LUMBAR FOUR-,SACRAL ONE, LUMBAR THREE-FOUR POSTERIOR DECOMPRESSION Procedure Note  Linda Haley female 66 y.o. 03/25/2021  Procedure(s) and Anesthesia Type:    * Direct Lateral Interbody Fusion  Lumbar one-two, Lumbar two-three, Lumbar three-four - General    * POSTERIOR LATERAL FUSION W/CONNECTION OF INSTRUMENTATION TO PREVIOUS LUMBAR FOUR-,SACRAL ONE, LUMBAR THREE-FOUR POSTERIOR DECOMPRESSION - General  Surgeon(s) and Role:    Maisie Fus, Coy Saunas, MD - Primary    * Dawley, Alan Mulder, DO - Assisting   Indications: This is a 66 year old woman who had previously undergone L4-S1 posterior decompression and fusion and ALIFs in 2021 who developed progressive and severe back pain and left leg pain which was progressive despite nonsurgical therapies.  She had undergone physical therapy which made her symptoms worse and was on a fair amount of pain medication.  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  I had a long discussion with her regarding treatment options.  Given her symptoms were getting significantly worse, it was reasonable to consider extending her fusion and correcting her deformity to give her the best chance of functional recovery.  I discussed the general technique of surgery, as well as risk, benefits, terms, and expected convalescence.  Risks discussed included, but were not limited to, bleeding, pain, infection, scar, pseudoarthrosis, adjacent segment disease, neurologic deficit, spinal fluid leak, damage nearby organs, coma, and death.  Informed consent was obtained.  Surgeon: Bedelia Person   Assistants: Monia Pouch, DO. Please note, no  qualified trainees were available to assist with the procedure.  Assistance was required to expedite opening and closure for the posterior portion of the procedure.  Anesthesia: General endotracheal anesthesia  ASA Class: 2    Procedure Detail  Direct Lateral Interbody Fusion  Lumbar one-two, Lumbar two-three, Lumbar three-four, POSTERIOR LATERAL FUSION W/CONNECTION OF INSTRUMENTATION TO PREVIOUS LUMBAR FOUR-,SACRAL ONE, LUMBAR THREE-FOUR POSTERIOR DECOMPRESSION  L1-2, L2-3, L3-4 direct lateral interbody fusion L1-2, L2-3, L3-4 posterior arthrodesis Left L3-4 posterior decompression including facetectomies Removal of previous posterior instrumentation Segmental instrumentation with pedicle screw/rod construct L1-L2-L3-L4-L5-S1 Harvest of local autograft Use of morselized allograft and osteopromotive material   The patient brought to the op room.  General anesthesia was induced and patient was intubated by the anesthesia service.  After appropriate lines monitors were placed, patient was positioned in the lateral decubitus position with the right side up with all pressure points padded and eyes protected.  X-ray was performed to confirm true lateral positioning.  Slight break in the bed was used to gently open up the concavity of her coronal deformity on the right side.  The flank was preprepped with alcohol and prepped and draped in sterile fashion.  A timeout was performed.  Preoperative antibiotics were administered.  Baseline neuro monitoring was obtained.  The disc spaces were marked out over the skin.  There is already some correction of her coronal deformity with positioning.  A curvilinear incision was planned spanning the 3 disc spaces over the flank.  Subcutaneous tissue was divided.  The lateral muscle layers were opened bluntly and the transversalis fascia was opened to enter the retroperitoneal space.  Retroperitoneal dissection was performed with reflection of the peritoneal  contents anteriorly with finger dissection.  Initial dilator  was then docked on the L3-4 disc space under lateral x-ray guidance.  The initial dilator was stimulated and no nerves were in proximity.  The dilator was then passed through the psoas muscle and placed on the L3-4 interbody space.  There was a fair amount of lateral listhesis noted and osteophytic ridging.  Stimulation was performed and with no nearby nerves were confirmed, sequential dilators followed by stimulation was performed until the lateral retractor was placed and secured to the bed under x-ray guidance.  Visualization and stimulation of the field including the posterior retractor blade showed no nerves in proximity.  Shim was then placed in the disc space under AP guidance.  The dilator was then opened..  Remaining small amount of muscle was reflected off of the disc space.  The disc was opened with a 15 blade and disc was removed with pituitary rongeurs.  Cobb instrument was then passed under x-ray guidance to clear cartilage off of the endplates.  Box cutter and pituitary was used to remove remaining disc.  Rasps and curettes were used to prepare the endplates.  Trials were then used and appropriate sized interbody was selected.  The interbody was placed under x-ray guidance with good reduction of the lateral listhesis and spondylolisthesis and restoration of disc space height.  Meticulous hemostasis was obtained.  The shim was removed and the retractor was removed.  Attention was then turned to the L2-3 disc space. Initial dilator was then docked on the L2-3 disc space under lateral x-ray guidance.  The initial dilator was stimulated and no nerves were in proximity.  The dilator was then passed through the psoas muscle and placed on the L3-4 interbody space.  There was a fair amount of osteophytic ridging.  Stimulation was performed and with no nearby nerves were confirmed, sequential dilators followed by stimulation was performed until the  lateral retractor was placed and secured to the bed under x-ray guidance.  Visualization and stimulation of the field including the posterior retractor blade showed no nerves in proximity.  Shim was then placed in the disc space under AP guidance.  The dilator was then opened..  Remaining small amount of muscle was reflected off of the disc space.  The disc was opened with a 15 blade and disc was removed with pituitary rongeurs.  Cobb instrument was then passed under x-ray guidance to clear cartilage off of the endplates.  Box cutter and pituitary was used to remove remaining disc.  Rasps and curettes were used to prepare the endplates.  Trials were then used and appropriate sized interbody was selected.  The interbody was placed under x-ray guidance with good reduction of the lateral listhesis and spondylolisthesis and restoration of disc space height.  Meticulous hemostasis was obtained.  The shim was removed and the retractor was removed.  Attention was then turned to the L1-2 disc space. The 12th rib was pushed upward to allow access to the upper lumbar spine.  Initial dilator was docked over the L1-2 disc space under lateral x-ray guidance.  The initial dilator was stimulated and no nerves were in proximity.  The dilator was then passed through the psoas muscle and placed on the L3-4 interbody space.  There was a fair amount of osteophytic ridging.  Stimulation was performed and with no nearby nerves were confirmed, sequential dilators followed by stimulation was performed until the lateral retractor was placed and secured to the bed under x-ray guidance.  Visualization and stimulation of the field including the posterior retractor  blade showed no nerves in proximity.  Shim was then placed in the disc space under AP guidance.  The dilator was then opened..  Remaining small amount of muscle was reflected off of the disc space.  The disc was opened with a 15 blade and disc was removed with pituitary rongeurs.   Cobb instrument was then passed under x-ray guidance to clear cartilage off of the endplates.  Box cutter and pituitary was used to remove remaining disc.  Rasps and curettes were used to prepare the endplates.  Trials were then used and appropriate sized interbody was selected.  The interbody was placed under x-ray guidance with good reduction of the lateral listhesis and spondylolisthesis and restoration of disc space height.  Meticulous hemostasis was obtained.  The shim was removed and the retractor was removed.  There were no changes in neuro monitoring during this first portion of the procedure.  The wound was irrigated thoroughly and meticulous hemostasis was obtained.  Final x-rays were obtained.  The muscle fascia layer was closed with 0 Vicryl stitches.  The dermal layer was closed with 2-0 Vicryl stitches and the skin was closed with 4-0 Monocryl in subcuticular manner followed by Dermabond.  A sterile dressing was then placed.    Patient was then flipped prone onto the open McDermitt table with all pressure points padded and eyes protected.  The low back was preprepped with prepped and draped in sterile fashion.  A second timeout was performed.  Additional antibiotics were given.  The patient's previous incision was opened sharply with a 10 blade and the incision extended t superiorly.  The subcutaneous tissue and fascia were sharply opened.  Subperiosteal dissection was performed, exposing the L1, L2, L3 lamina as well as remaining superior L4 in subperiosteal fashion.  The transverse processes of L2, L3, exposed and decorticated.  Dense scar was opened sharply and dissected out laterally to expose the previous TSRH posterior instrumentation. Left L3 laminectomy and facetectomy was performed, with harvest of autograft.  Superior articulating facet was removed and the thecal sac and traversing nerve root was identified.  Overgrown superior articulating facet was removed with rongeurs, with good  decompression of the thecal sac and nerve root comfirmed with easy passage of ball ended nerve hook.   The TSRH rod nuts and rods were removed and screws were removed system.  Using x-ray guidance, fracture screws were placed through the same screw paths with good purchase.  Using x-ray guidance, pilot holes were placed for the pedicle screws and gearshift pedicle probe was used to cannulate the pedicles at each of these levels.  Feeler confirmed good bony channels.  The screw paths were tapped with pulmonary failure again confirming good bony channels.  Screws were then placed under x-ray guidance with good purchase.  160 mm rods were contoured, placed and final tightened with screw caps.  Final x-rays showed good instrumentation positioning.  The wound was irrigated thoroughly.  Additional decortication was performed of L1, L2, L3, and L4.  Autograft mixed with allograft placed in the lateral gutters bilaterally as well as over lamina.  Exparel was injected in the muscles.  Vancomycin powder was placed in the wound.  2 10 flat JP drains were placed in the subfascial space and tunneled out the skin and secured with a stitch.  The muscle layer was closed with 0 Vicryl stitches.  The fascia was closed with 0 Vicryl stitches.  The subcutaneous layer was closed with 2-0 Vicryl stitches.  The skin was  closed with 4-0 Monocryl in subcuticular manner followed by Dermabond and a sterile dressing.  Patient was then flipped supine and extubated by the anesthesia service.  All counts were correct at the end of surgery.  No complications were noted.   Findings: successful coronal correction of the lumbar deformity with interbody fusion and decompression  Estimated Blood Loss:  400 ml         Drains: JACKSON-PRATT (JP) x2          Total IV Fluids: see anesthesia records  Blood Given: none          Specimens:  Previous L4-S1 posterior TSRH hardware removed         Implants:  Alpha-Tec IdentiTi  Interbodies L1-2: 8x18x50 mm L2-3: 6PH, 10AH x 22 x 50, 10 degree lordosis L3-4: 8 x 22 x 50 Pedicle screws L1: 5.5x50 mm x 2 L2: 5.0 x 50 mm L, 5.5 x 50 mm R L3: 7.5 x 55 mm x 2 L4: 8.5 x 55 mm x 2 L5: 8.5 x 55 mm x 2 S1: 8.5 x 45 mm x 2 Titanium rods 160 mm rods x 2 Biologics 17 ml Neocore, medium Medtronic Infuse in interbodies 10 ml Magnifuse, 6 ml [place] in posterolateral gutters.  Complications:  * No complications entered in OR log *         Disposition: PACU - hemodynamically stable.         Condition: stable

## 2021-03-25 NOTE — Anesthesia Procedure Notes (Signed)
Arterial Line Insertion Start/End2/23/2023 7:15 AM, 03/25/2021 7:22 AM Performed by: Lewie Loron, MD, Waynard Edwards, CRNA, CRNA  Patient location: Pre-op. Preanesthetic checklist: patient identified, IV checked, site marked, risks and benefits discussed, surgical consent, monitors and equipment checked, pre-op evaluation, timeout performed and anesthesia consent Lidocaine 1% used for infiltration radial was placed Catheter size: 20 G Hand hygiene performed  and maximum sterile barriers used   Attempts: 1 Procedure performed without using ultrasound guided technique. Following insertion, dressing applied. Post procedure assessment: normal and unchanged  Patient tolerated the procedure well with no immediate complications.

## 2021-03-25 NOTE — H&P (Signed)
CC: back pain, right leg pain  HPI:     Patient is a 66 y.o. female presents with hx of L4-S1 ALIF, posterior fusion in 2021 developed worsening back and leg pain, worse on the left, with neurogenic claudication. 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 Nonsurgical therapies failed to help her pain.    There are no problems to display for this patient.  Past Medical History:  Diagnosis Date   Anxiety    Chronic cough    Smoker's cough   Depression    Migraine     Past Surgical History:  Procedure Laterality Date   BACK SURGERY     L4-5   CESAREAN SECTION     x1   HIP ARTHROPLASTY Left    MULTIPLE TOOTH EXTRACTIONS     WISDOM TOOTH EXTRACTION      Medications Prior to Admission  Medication Sig Dispense Refill Last Dose   ARIPiprazole (ABILIFY) 5 MG tablet Take 5 mg by mouth daily.   03/25/2021 at 0130   bisacodyl (DULCOLAX) 5 MG EC tablet Take 5 mg by mouth daily as needed for moderate constipation.   Past Week   Calcium Carb-Cholecalciferol (CALCIUM 600 + D PO) Take 1 tablet by mouth daily.   03/22/2021   clonazePAM (KLONOPIN) 1 MG tablet Take 0.5 mg by mouth 2 (two) times daily as needed for anxiety.   03/24/2021   cyclobenzaprine (FLEXERIL) 10 MG tablet Take 10 mg by mouth 3 (three) times daily as needed for muscle spasms.   03/25/2021 at 0130   ferrous sulfate 325 (65 FE) MG tablet Take 325 mg by mouth daily with breakfast.   03/22/2021   FLUoxetine (PROZAC) 20 MG capsule Take 20 mg by mouth in the morning.   03/25/2021   FLUoxetine (PROZAC) 40 MG capsule Take 40 mg by mouth daily in the afternoon.   03/25/2021   gabapentin (NEURONTIN) 300 MG capsule Take 300 mg by mouth in the morning and at bedtime.   03/25/2021   ibuprofen (ADVIL) 800 MG tablet Take 800 mg by mouth every 4 (four) hours as needed for moderate pain.   03/22/2021   magnesium gluconate (MAGONATE) 500 MG tablet Take 500 mg by mouth at bedtime.   03/22/2021    oxyCODONE-acetaminophen (PERCOCET/ROXICET) 5-325 MG tablet Take 2 tablets by mouth in the morning and at bedtime.   03/25/2021 at 0130   Potassium 99 MG TABS Take 99 mg by mouth daily.   03/22/2021   pseudoephedrine (SUDAFED) 120 MG 12 hr tablet Take 120 mg by mouth in the morning.   03/24/2021   topiramate (TOPAMAX) 50 MG tablet Take 50 mg by mouth 2 (two) times daily.   03/25/2021   vitamin B-12 (CYANOCOBALAMIN) 1000 MCG tablet Take 1,000 mcg by mouth daily.   03/22/2021   docusate sodium (COLACE) 100 MG capsule Take 200 mg by mouth at bedtime.   Unknown   No Known Allergies  Social History   Tobacco Use   Smoking status: Never   Smokeless tobacco: Never  Substance Use Topics   Alcohol use: Never    History reviewed. No pertinent family history.   Review of Systems Pertinent items are noted in HPI.  Objective:   Patient Vitals for the past 8 hrs:  BP Temp Temp src Pulse Resp SpO2  03/25/21 0542 120/74 97.8 F (36.6 C) Oral 81 17 100 %   No intake/output data recorded. No intake/output data recorded.  General : Alert, cooperative, no distress, appears stated age   Head:  Normocephalic/atraumatic    Eyes: PERRL, conjunctiva/corneas clear, EOM's intact. Fundi could not be visualized Neck: Supple Chest:  Respirations unlabored Chest wall: no tenderness or deformity Heart: Regular rate and rhythm Abdomen: Soft, nontender and nondistended Extremities: warm and well-perfused Skin: normal turgor, color and texture Neurologic:  Alert, oriented x 3.  Eyes open spontaneously. PERRL, EOMI, VFC, no facial droop. V1-3 intact.  No dysarthria, tongue protrusion symmetric.  CNII-XII intact. Normal strength, sensation and reflexes throughout.  No pronator drift, full strength in legs + SLR on left Well healed abdominal and back incision       Data Review  See clinic note for details  Assessment:   Active Problems:   * No active hospital problems. *  Severe lumbar  degenerative disease with stenosis, coronal deformity, instability  Plan:  - plan for L1-4 DLIFs, posterior fusion

## 2021-03-25 NOTE — Anesthesia Procedure Notes (Signed)
Procedure Name: Intubation Date/Time: 03/25/2021 8:15 AM Performed by: Nolon Nations, MD Pre-anesthesia Checklist: Patient identified, Emergency Drugs available, Suction available and Patient being monitored Patient Re-evaluated:Patient Re-evaluated prior to induction Oxygen Delivery Method: Circle system utilized Preoxygenation: Pre-oxygenation with 100% oxygen Induction Type: IV induction Ventilation: Mask ventilation without difficulty Laryngoscope Size: Mac and 4 Grade View: Grade I Tube type: Oral Tube size: 7.0 mm Number of attempts: 2 Airway Equipment and Method: Stylet and Oral airway Placement Confirmation: ETT inserted through vocal cords under direct vision, positive ETCO2 and breath sounds checked- equal and bilateral Secured at: 21 cm Tube secured with: Tape Dental Injury: Teeth and Oropharynx as per pre-operative assessment  Comments: Attempt by Raven x 1, paramedic student. Unable to visualize glottis. Nicholous Girgenti x 1, grade 1 view. Mac 3 likely better blade for patient but unavailable in room.

## 2021-03-25 NOTE — Progress Notes (Signed)
Orthopedic Tech Progress Note Patient Details:  Linda Haley 1955-08-13 [id number]  Patient ID: Linda Haley, female   DOB: 06-Apr-1955, 66 y.o.   MRN: [id number] Brace dropped off to patient. Al Decant 03/25/2021, 7:41 PM

## 2021-03-26 ENCOUNTER — Other Ambulatory Visit: Payer: Self-pay

## 2021-03-26 MED ORDER — OXYCODONE-ACETAMINOPHEN 5-325 MG PO TABS
1.0000 | ORAL_TABLET | ORAL | Status: DC | PRN
  Administered 2021-03-26 – 2021-03-27 (×3): 2 via ORAL
  Filled 2021-03-26 (×3): qty 2

## 2021-03-26 MED ORDER — CYCLOBENZAPRINE HCL 10 MG PO TABS
10.0000 mg | ORAL_TABLET | Freq: Three times a day (TID) | ORAL | Status: DC | PRN
  Administered 2021-03-26 – 2021-03-27 (×3): 10 mg via ORAL
  Filled 2021-03-26 (×3): qty 1

## 2021-03-26 MED ORDER — OXYCODONE HCL 5 MG PO TABS
5.0000 mg | ORAL_TABLET | ORAL | Status: DC | PRN
  Administered 2021-03-26 – 2021-03-27 (×3): 10 mg via ORAL
  Filled 2021-03-26 (×3): qty 2

## 2021-03-26 MED ORDER — HYDROMORPHONE HCL 1 MG/ML IJ SOLN: 0.5000 mg | INTRAMUSCULAR | Status: DC | PRN

## 2021-03-26 MED ORDER — METHOCARBAMOL 500 MG PO TABS
500.0000 mg | ORAL_TABLET | Freq: Four times a day (QID) | ORAL | Status: DC | PRN
  Administered 2021-03-26 (×2): 500 mg via ORAL
  Filled 2021-03-26 (×3): qty 1

## 2021-03-26 MED FILL — Thrombin For Soln 5000 Unit: CUTANEOUS | Qty: 5000 | Status: AC

## 2021-03-26 NOTE — Anesthesia Postprocedure Evaluation (Signed)
Anesthesia Post Note  Patient: Osiris Charles  Procedure(s) Performed: Direct Lateral Interbody Fusion  Lumbar one-two, Lumbar two-three, Lumbar three-four POSTERIOR LATERAL FUSION W/CONNECTION OF INSTRUMENTATION TO PREVIOUS LUMBAR FOUR-,SACRAL ONE, LUMBAR THREE-FOUR POSTERIOR DECOMPRESSION     Patient location during evaluation: PACU Anesthesia Type: General Level of consciousness: awake and alert Pain management: pain level controlled Vital Signs Assessment: post-procedure vital signs reviewed and stable Respiratory status: spontaneous breathing, nonlabored ventilation and respiratory function stable Cardiovascular status: blood pressure returned to baseline and stable Postop Assessment: no apparent nausea or vomiting Anesthetic complications: no   No notable events documented.  Last Vitals:  Vitals:   03/25/21 2306 03/26/21 0355  BP: (!) 115/59 114/68  Pulse: 94 88  Resp: 16 16  Temp: 37 C 37.1 C  SpO2: 97% 100%    Last Pain:  Vitals:   03/26/21 0556  TempSrc:   PainSc: 4                  Lowella Curb

## 2021-03-26 NOTE — Progress Notes (Signed)
Subjective: Patient reports that she is doing well overall. She has no pain or paraesthesia in her BLE. She is having 7/10 pain at her incisional region. She has ambulated well in the hall with nursing staff. No acute events overnight.   Objective: Vital signs in last 24 hours: Temp:  [97.7 F (36.5 C)-99.3 F (37.4 C)] 99.3 F (37.4 C) (02/24 0740) Pulse Rate:  [81-94] 89 (02/24 0740) Resp:  [12-21] 16 (02/24 0740) BP: (109-161)/(59-89) 109/60 (02/24 0740) SpO2:  [87 %-100 %] 99 % (02/24 0740) Arterial Line BP: (156-168)/(67-79) 161/67 (02/23 1605)  Intake/Output from previous day: 02/23 0701 - 02/24 0700 In: 3400 [I.V.:2700; IV Piggyback:700] Out: 2230 [Urine:1525; Drains:505; Blood:200] Intake/Output this shift: No intake/output data recorded.  Physical Exam: Patient is awake, A/O X 4, conversant, and in good spirits. They are in NAD and VSS. Doing well. Speech is fluent and appropriate. MAEW with good strength that is symmetric bilaterally. 5/5 BUE/BLE. Sensation to light touch is intact. PERLA, EOMI. CNs grossly intact. Dressing is clean dry intact. Incision is well approximated with no drainage, erythema, or edema.  Top JP drain with 160 ml of sanguineous  drainage. Bottom JP drain with 90 ml of sanguineous  drainage.  Lab Results: No results for input(s): WBC, HGB, HCT, PLT in the last 72 hours. BMET No results for input(s): NA, K, CL, CO2, GLUCOSE, BUN, CREATININE, CALCIUM in the last 72 hours.  Studies/Results: DG Lumbar Spine Complete  Result Date: 03/25/2021 CLINICAL DATA:  Multiple intraoperative fluoroscopic image EXAM: LUMBAR SPINE - COMPLETE 4+ VIEW COMPARISON:  Radiographs dated March 01, 2021 FINDINGS: Intraoperative utilization of fluoroscopy for L1-L4 XLIF. Total fluoroscopic time was 4 minutes and 21 seconds. Total fluoroscopic dose was 97.74 mGy. IMPRESSION: Intraoperative utilization of fluoroscopy Electronically Signed   By: Larose Hires D.O.   On:  03/25/2021 15:21   DG C-Arm 1-60 Min-No Report  Result Date: 03/25/2021 Fluoroscopy was utilized by the requesting physician.  No radiographic interpretation.   DG C-Arm 1-60 Min-No Report  Result Date: 03/25/2021 Fluoroscopy was utilized by the requesting physician.  No radiographic interpretation.   DG C-Arm 1-60 Min-No Report  Result Date: 03/25/2021 Fluoroscopy was utilized by the requesting physician.  No radiographic interpretation.   DG C-Arm 1-60 Min-No Report  Result Date: 03/25/2021 Fluoroscopy was utilized by the requesting physician.  No radiographic interpretation.   DG C-Arm 1-60 Min-No Report  Result Date: 03/25/2021 Fluoroscopy was utilized by the requesting physician.  No radiographic interpretation.   DG C-Arm 1-60 Min-No Report  Result Date: 03/25/2021 Fluoroscopy was utilized by the requesting physician.  No radiographic interpretation.   DG C-Arm 1-60 Min-No Report  Result Date: 03/25/2021 Fluoroscopy was utilized by the requesting physician.  No radiographic interpretation.    Assessment/Plan: Patient is post-op day 1 s/p L1-4 DLIFs and posterior fusion. She is recovering well and reports a resolution of her lower extremity symptoms.  Her only complaint is of incisional pain.  She has ambulated with nursing staff and is awaiting PT/OT evaluation.  Continue LSO brace when OOB. Continue working on pain control, mobility and ambulating patient. Will assess readiness for discharge this afternoon after her JP drains are removed, her pain is under better control, and she has worked with therapies.    LOS: 1 day     Council Mechanic, DNP, NP-C 03/26/2021, 8:04 AM

## 2021-03-26 NOTE — Evaluation (Signed)
Occupational Therapy Evaluation Patient Details Name: Linda Haley MRN: [id number] DOB: May 29, 1955 Today's Date: 03/26/2021   History of Present Illness 66 y/o female admitted on 03/25/21 following DLIF L1-4 with connection to previous PLIF L4-S1 with L3-4 posterior decompression. No significant PMH.   Clinical Impression   Chart reviewed, pt greeted in bedside chair, requesting to return to bed due to 10/10 reported pain. Rn notified of pt pain level, pt still in agreement for participation in evaluation. TLSO donned in sitting with good awareness noted from pt on donning/doffing device. Pt performed LB dressing ADLs with MIN A with hip kit, functional mobility with SUP-MOD I. Provided education on use of 3 in 1 over toilet, pt reports she has a tub transfer bench for use when she is cleared for showering. Additional education provided re: ADL completion with precautions, bed mobility, falls prevention, home safety. Pt is left in bed, all needs met. Continues to report pain, RN aware. No further OT needs identified at this time. Please re-consult if there is a change in functional status.      Recommendations for follow up therapy are one component of a multi-disciplinary discharge planning process, led by the attending physician.  Recommendations may be updated based on patient status, additional functional criteria and insurance authorization.   Follow Up Recommendations  No OT follow up    Assistance Recommended at Discharge PRN  Patient can return home with the following Help with stairs or ramp for entrance;A little help with bathing/dressing/bathroom    Functional Status Assessment  Patient has had a recent decline in their functional status and demonstrates the ability to make significant improvements in function in a reasonable and predictable amount of time.  Equipment Recommendations  BSC/3in1    Recommendations for Other Services       Precautions / Restrictions  Precautions Precautions: Back Precaution Booklet Issued: Yes (comment) Required Braces or Orthoses: Spinal Brace Spinal Brace: Thoracolumbosacral orthotic;Applied in sitting position Restrictions Weight Bearing Restrictions: No      Mobility Bed Mobility Overal bed mobility: Needs Assistance Bed Mobility: Sit to Sidelying         Sit to sidelying: Modified independent (Device/Increase time)      Transfers Overall transfer level: Modified independent Equipment used: Rolling walker (2 wheels)               General transfer comment: increased time      Balance Overall balance assessment: Mild deficits observed, not formally tested                                         ADL either performed or assessed with clinical judgement   ADL Overall ADL's : Needs assistance/impaired Eating/Feeding: Independent   Grooming: Wash/dry hands;Wash/dry face;Sitting;Standing;Supervision/safety Grooming Details (indicate cue type and reason): anticipated             Lower Body Dressing: Minimal assistance;Cueing for back precautions;Sit to/from stand Lower Body Dressing Details (indicate cue type and reason): with AE Toilet Transfer: Supervision/safety;Rolling walker (2 wheels);Ambulation Toilet Transfer Details (indicate cue type and reason): simulated         Functional mobility during ADLs: Supervision/safety;Rolling walker (2 wheels) General ADL Comments: limited by pain, however good mobility noted with approrpiate safety awareness     Vision   Vision Assessment?: No apparent visual deficits     Perception     Praxis  Pertinent Vitals/Pain Pain Assessment Pain Assessment: 0-10 Pain Score: 10-Worst pain ever Pain Location: back Pain Descriptors / Indicators: Operative site guarding, Discomfort, Crying Pain Intervention(s): Monitored during session, Limited activity within patient's tolerance, Patient requesting pain meds-RN notified      Hand Dominance     Extremity/Trunk Assessment Upper Extremity Assessment Upper Extremity Assessment: Overall WFL for tasks assessed   Lower Extremity Assessment Lower Extremity Assessment: Generalized weakness   Cervical / Trunk Assessment Cervical / Trunk Assessment: Back Surgery   Communication Communication Communication: No difficulties   Cognition Arousal/Alertness: Awake/alert Behavior During Therapy: WFL for tasks assessed/performed Overall Cognitive Status: Within Functional Limits for tasks assessed                                       General Comments       Exercises     Shoulder Instructions      Home Living Family/patient expects to be discharged to:: Private residence Living Arrangements: Other relatives;Other (Comment) (sister) Available Help at Discharge: Family;Available 24 hours/day Type of Home: House Home Access: Stairs to enter [employer] of Steps: 2   Home Layout: One level     Bathroom Shower/Tub: Teacher, early years/pre: Standard     Home Equipment: Conservation officer, nature (2 wheels);Tub bench   Additional Comments: staying with sister in [place] as long as needed following surgery      Prior Functioning/Environment Prior Level of Function : Independent/Modified Independent;Driving                        OT Problem List:        OT Treatment/Interventions:      OT Goals(Current goals can be found in the care plan section) Acute Rehab OT Goals Patient Stated Goal: less pain OT Goal Formulation: With patient Time For Goal Achievement: 04/09/21 Potential to Achieve Goals: Good  OT Frequency:      Co-evaluation              AM-PAC OT "6 Clicks" Daily Activity     Outcome Measure Help from another person eating meals?: None Help from another person taking care of personal grooming?: None Help from another person toileting, which includes using toliet, bedpan, or urinal?:  None Help from another person bathing (including washing, rinsing, drying)?: None Help from another person to put on and taking off regular upper body clothing?: None Help from another person to put on and taking off regular lower body clothing?: None 6 Click Score: 24   End of Session Equipment Utilized During Treatment: Rolling walker (2 wheels) Nurse Communication: Mobility status  Activity Tolerance: Patient limited by pain Patient left: in bed;with call bell/phone within reach  OT Visit Diagnosis: Unsteadiness on feet (R26.81)                Time: 9983-3825 OT Time Calculation (min): 14 min Charges:  OT General Charges $OT Visit: 1 Visit OT Evaluation $OT Eval Low Complexity: 1 Low  Shanon Payor, OTD OTR/L  03/26/21, 10:09 AM

## 2021-03-26 NOTE — Evaluation (Signed)
Physical Therapy Evaluation & Discharge Patient Details Name: Linda Haley MRN: [id number] DOB: September 09, 1955 Today's Date: 03/26/2021  History of Present Illness  66 y/o female admitted on 03/25/21 following DLIF L1-4 with connection to previous PLIF L4-S1 with L3-4 posterior decompression. No significant PMH.  Clinical Impression  Patient admitted following above procedure. Patient is limited by pain but overall mobilizing well at modI level with use of Rw for support. Patient will have sister to assist for mobility at home as needed. Educated patient on back precautions, brace wear, and progressive walking program to assist with returning to independence without RW, patient verbalized understanding. Provided handout for reference. No further skilled PT needs identified acutely. No PT follow up recommended at this time.        Recommendations for follow up therapy are one component of a multi-disciplinary discharge planning process, led by the attending physician.  Recommendations may be updated based on patient status, additional functional criteria and insurance authorization.  Follow Up Recommendations No PT follow up    Assistance Recommended at Discharge PRN  Patient can return home with the following       Equipment Recommendations BSC/3in1  Recommendations for Other Services       Functional Status Assessment Patient has had a recent decline in their functional status and demonstrates the ability to make significant improvements in function in a reasonable and predictable amount of time.     Precautions / Restrictions Precautions Precautions: Back Precaution Booklet Issued: Yes (comment) Required Braces or Orthoses: Spinal Brace Spinal Brace: Thoracolumbosacral orthotic;Applied in sitting position Restrictions Weight Bearing Restrictions: No      Mobility  Bed Mobility               General bed mobility comments: up in recliner on arrival    Transfers Overall  transfer level: Modified independent Equipment used: Rolling Linda Haley (2 wheels)               General transfer comment: increased time to complete but no need for physical assist    Ambulation/Gait Ambulation/Gait assistance: Modified independent (Device/Increase time) Gait Distance (Feet): 400 Feet Assistive device: Rolling Linda Haley (2 wheels) Gait Pattern/deviations: Step-through pattern, Decreased stride length Gait velocity: decreased     General Gait Details: patient prefers use of Rw for comfort as she used RW after previous back surgery  Stairs Stairs: Yes Stairs assistance: Modified independent (Device/Increase time) Stair Management: One rail Right, Step to pattern, Forwards Number of Stairs: 1    Wheelchair Mobility    Modified Rankin (Stroke Patients Only)       Balance Overall balance assessment: Mild deficits observed, not formally tested                                           Pertinent Vitals/Pain Pain Assessment Pain Assessment: Faces Faces Pain Scale: Hurts even more Pain Location: back Pain Descriptors / Indicators: Operative site guarding, Discomfort Pain Intervention(s): Monitored during session    Home Living Family/patient expects to be discharged to:: Private residence Living Arrangements: Other relatives (sister) Available Help at Discharge: Family;Available 24 hours/day Type of Home: House Home Access: Stairs to enter   Linda Haley of Steps: 2   Home Layout: One level Home Equipment: Agricultural consultant (2 wheels);Tub bench Additional Comments: lives in [name] but is currently staying with sister in [name] so she has assistance following this surgery  Prior Function Prior Level of Function : Independent/Modified Independent;Driving                     Hand Dominance        Extremity/Trunk Assessment   Upper Extremity Assessment Upper Extremity Assessment: Defer to OT evaluation     Lower Extremity Assessment Lower Extremity Assessment: Generalized weakness    Cervical / Trunk Assessment Cervical / Trunk Assessment: Back Surgery  Communication   Communication: No difficulties  Cognition Arousal/Alertness: Awake/alert Behavior During Therapy: WFL for tasks assessed/performed Overall Cognitive Status: Within Functional Limits for tasks assessed                                          General Comments      Exercises     Assessment/Plan    PT Assessment Patient does not need any further PT services  PT Problem List         PT Treatment Interventions      PT Goals (Current goals can be found in the Care Plan section)  Acute Rehab PT Goals Patient Stated Goal: to go home PT Goal Formulation: All assessment and education complete, DC therapy    Frequency       Co-evaluation               AM-PAC PT "6 Clicks" Mobility  Outcome Measure Help needed turning from your back to your side while in a flat bed without using bedrails?: None Help needed moving from lying on your back to sitting on the side of a flat bed without using bedrails?: None Help needed moving to and from a bed to a chair (including a wheelchair)?: None Help needed standing up from a chair using your arms (e.g., wheelchair or bedside chair)?: None Help needed to walk in hospital room?: None Help needed climbing 3-5 steps with a railing? : None 6 Click Score: 24    End of Session Equipment Utilized During Treatment: Back brace Activity Tolerance: Patient tolerated treatment well Patient left: in chair;with call bell/phone within reach Nurse Communication: Mobility status PT Visit Diagnosis: Muscle weakness (generalized) (M62.81)    Time: 5102-5852 PT Time Calculation (min) (ACUTE ONLY): 20 min   Charges:   PT Evaluation $PT Eval Low Complexity: 1 Low          Mcclellan Demarais A. Dan Humphreys PT, DPT Acute Rehabilitation Services Pager [phone] Office  [phone]   Viviann Spare 03/26/2021, 9:49 AM

## 2021-03-26 NOTE — Progress Notes (Signed)
Neurosurgery  Pt seen and examined.  MAEW, dressings c/d.  Leg pain improved from preop, back pain so far not more severe than preop.  She is eager to go home, likely will be ready for d/c tomorrow morning.

## 2021-03-27 MED ORDER — OXYCODONE HCL 5 MG PO TABS: 5.0000 mg | ORAL_TABLET | Freq: Four times a day (QID) | ORAL | 0 refills | Status: AC | PRN

## 2021-03-27 NOTE — Discharge Summary (Signed)
Physician Discharge Summary  Patient ID: Linda Haley MRN: [id number] DOB/AGE: January 02, 1956 66 y.o.  Admit date: 03/25/2021 Discharge date: 03/27/2021  Admission Diagnoses:  Lumbar scoliosis  Discharge Diagnoses:  Same Principal Problem:   Lumbar spine scoliosis   Discharged Condition: Stable  Hospital Course:  Linda Haley is a 66 y.o. female admitted after L1-L4 DLIF and extension of posterior fusion L1-S1. She has done well postop, with improvement in back pain. She is ambulating with assistance of rolling walker, tolerating diet, voiding normally with pain controlled with oral medication. She therefore requested d/c home.   Treatments: Surgery - L1-L4 DLIF, posterolateral fusion L1-L4 with extension of hardware L1-S1  Discharge Exam: Blood pressure 113/68, pulse 100, temperature 98.9 F (37.2 C), temperature source Oral, resp. rate 16, SpO2 100 %. Awake, alert, oriented Speech fluent, appropriate CN grossly intact 5/5 BUE/BLE Wound c/d/i  Disposition: Discharge disposition: 01-Home or Self Care       Discharge Instructions     Call MD for:  redness, tenderness, or signs of infection (pain, swelling, redness, odor or green/yellow discharge around incision site)   Complete by: As directed    Call MD for:  temperature >100.4   Complete by: As directed    Diet - low sodium heart healthy   Complete by: As directed    Discharge instructions   Complete by: As directed    Walk at home as much as possible, at least 4 times / day   Incentive spirometry RT   Complete by: As directed    Increase activity slowly   Complete by: As directed    Lifting restrictions   Complete by: As directed    No lifting > 10 lbs   May shower / Bathe   Complete by: As directed    48 hours after surgery   May walk up steps   Complete by: As directed    No dressing needed   Complete by: As directed    Other Restrictions   Complete by: As directed    No bending/twisting at waist       Allergies as of 03/27/2021   No Known Allergies      Medication List     TAKE these medications    ARIPiprazole 5 MG tablet Commonly known as: ABILIFY Take 5 mg by mouth daily.   bisacodyl 5 MG EC tablet Commonly known as: DULCOLAX Take 5 mg by mouth daily as needed for moderate constipation.   CALCIUM 600 + D PO Take 1 tablet by mouth daily.   clonazePAM 1 MG tablet Commonly known as: KLONOPIN Take 0.5 mg by mouth 2 (two) times daily as needed for anxiety.   cyclobenzaprine 10 MG tablet Commonly known as: FLEXERIL Take 10 mg by mouth 3 (three) times daily as needed for muscle spasms.   docusate sodium 100 MG capsule Commonly known as: COLACE Take 200 mg by mouth at bedtime.   ferrous sulfate 325 (65 FE) MG tablet Take 325 mg by mouth daily with breakfast.   FLUoxetine 40 MG capsule Commonly known as: PROZAC Take 40 mg by mouth daily in the afternoon.   FLUoxetine 20 MG capsule Commonly known as: PROZAC Take 20 mg by mouth in the morning.   gabapentin 300 MG capsule Commonly known as: NEURONTIN Take 300 mg by mouth in the morning and at bedtime.   ibuprofen 800 MG tablet Commonly known as: ADVIL Take 800 mg by mouth every 4 (four) hours as needed for moderate pain.  magnesium gluconate 500 MG tablet Commonly known as: MAGONATE Take 500 mg by mouth at bedtime.   oxyCODONE 5 MG immediate release tablet Commonly known as: Oxy IR/ROXICODONE Take 1 tablet (5 mg total) by mouth every 6 (six) hours as needed for up to 7 days for moderate pain ((score 4 to 6)).   oxyCODONE-acetaminophen 5-325 MG tablet Commonly known as: PERCOCET/ROXICET Take 2 tablets by mouth in the morning and at bedtime.   Potassium 99 MG Tabs Take 99 mg by mouth daily.   pseudoephedrine 120 MG 12 hr tablet Commonly known as: SUDAFED Take 120 mg by mouth in the morning.   topiramate 50 MG tablet Commonly known as: TOPAMAX Take 50 mg by mouth 2 (two) times daily.   vitamin  B-12 1000 MCG tablet Commonly known as: CYANOCOBALAMIN Take 1,000 mcg by mouth daily.               Discharge Care Instructions  (From admission, onward)           Start     Ordered   03/27/21 0000  No dressing needed        03/27/21 9024            Follow-up Information     Bedelia Person, MD Follow up in 3 week(s).   Specialty: Neurosurgery Contact information: [address] [place] [address] [phone]                 Signed: Jackelyn Hoehn 03/27/2021, 9:58 AM

## 2021-03-27 NOTE — Progress Notes (Signed)
Patient is discharged from room 3C07 at this time. Alert and in stable condition. IV site d/c'd and instructions read to patient and sister with understanding verbalized and all questions answered. Transported via wheelchair with all belongings at side.

## 2021-03-29 ENCOUNTER — Encounter (HOSPITAL_COMMUNITY): Payer: Self-pay | Admitting: Neurosurgery

## 2021-04-01 MED FILL — Sodium Chloride IV Soln 0.9%: INTRAVENOUS | Qty: 1000 | Status: AC

## 2021-04-01 MED FILL — Heparin Sodium (Porcine) Inj 1000 Unit/ML: INTRAMUSCULAR | Qty: 30 | Status: AC

## 2023-03-16 IMAGING — RF DG LUMBAR SPINE COMPLETE 4+V
1 series · 7 of 7 positions shown · non-contrast
Comparison: Radiographs dated March 01, 2021

CLINICAL DATA: Multiple intraoperative fluoroscopic image

EXAM:
LUMBAR SPINE - COMPLETE 4+ VIEW

[Series 1: run · 7 of 7 slices shown]
[im 1/7]
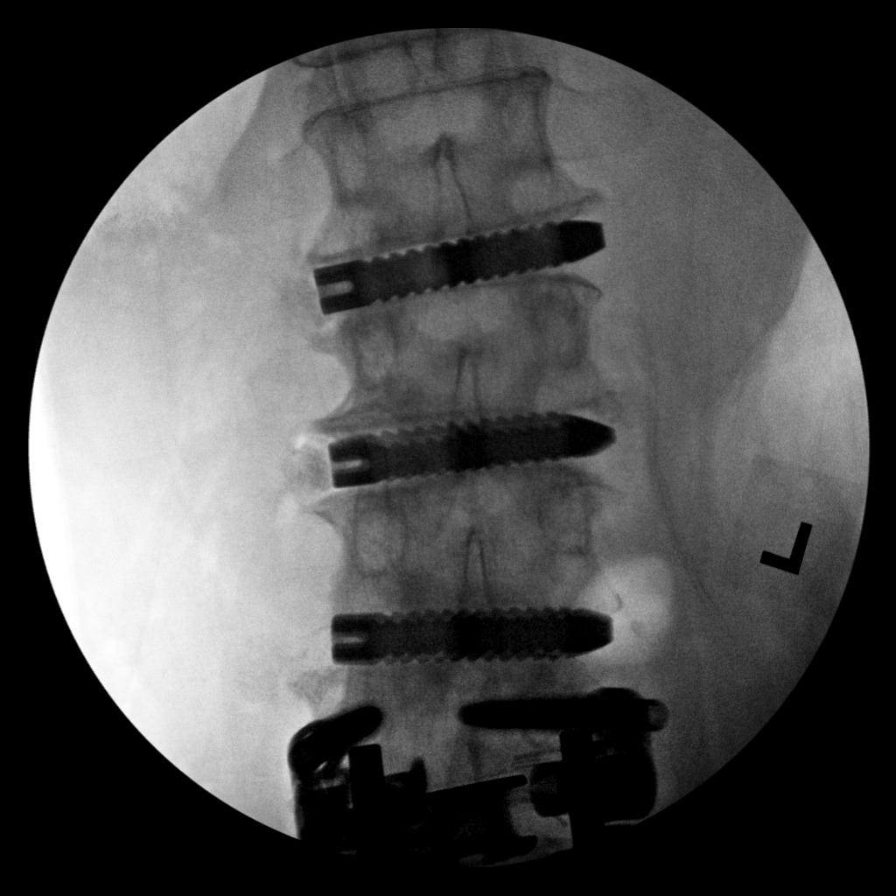
[im 2/7]
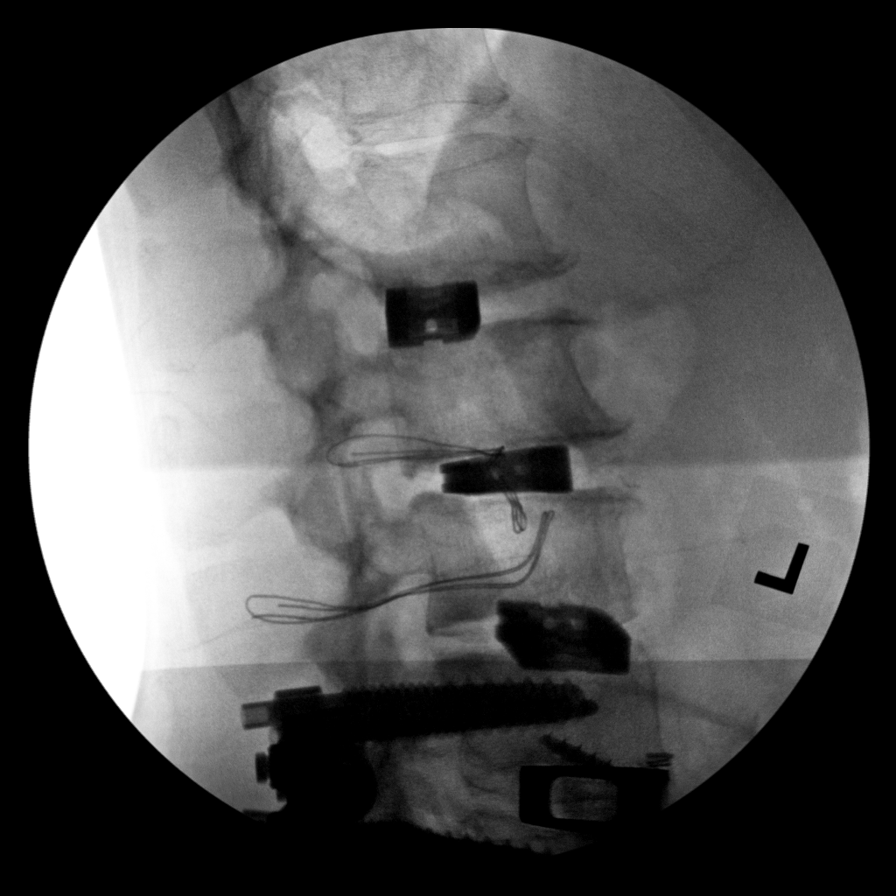
[im 3/7]
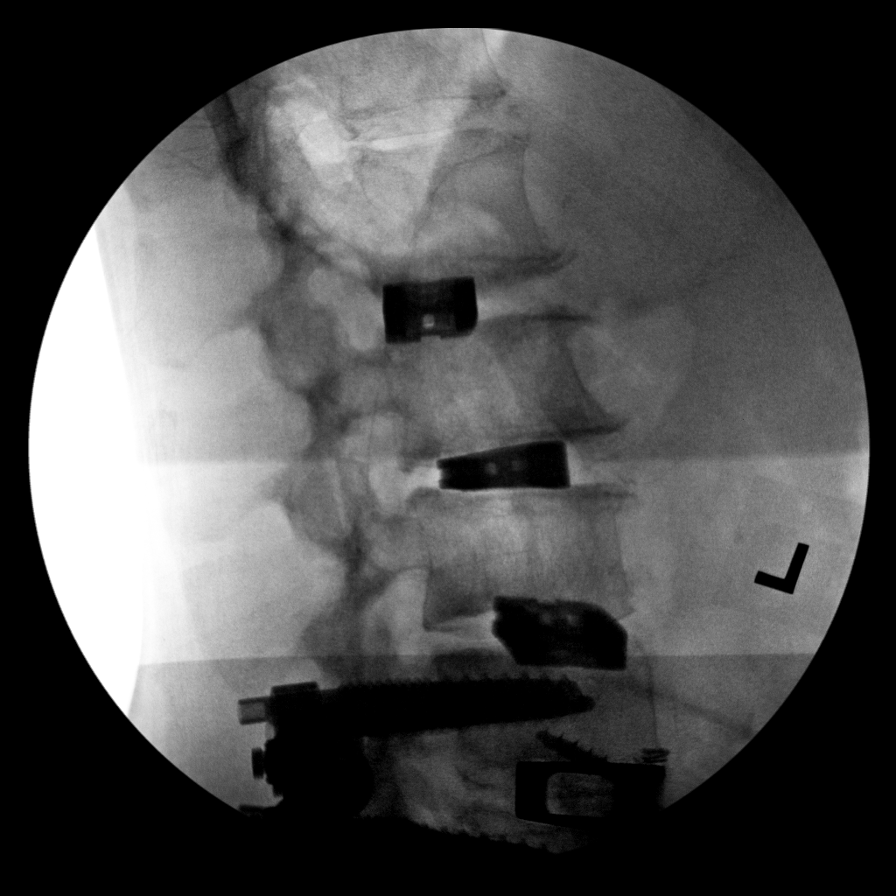
[im 4/7]
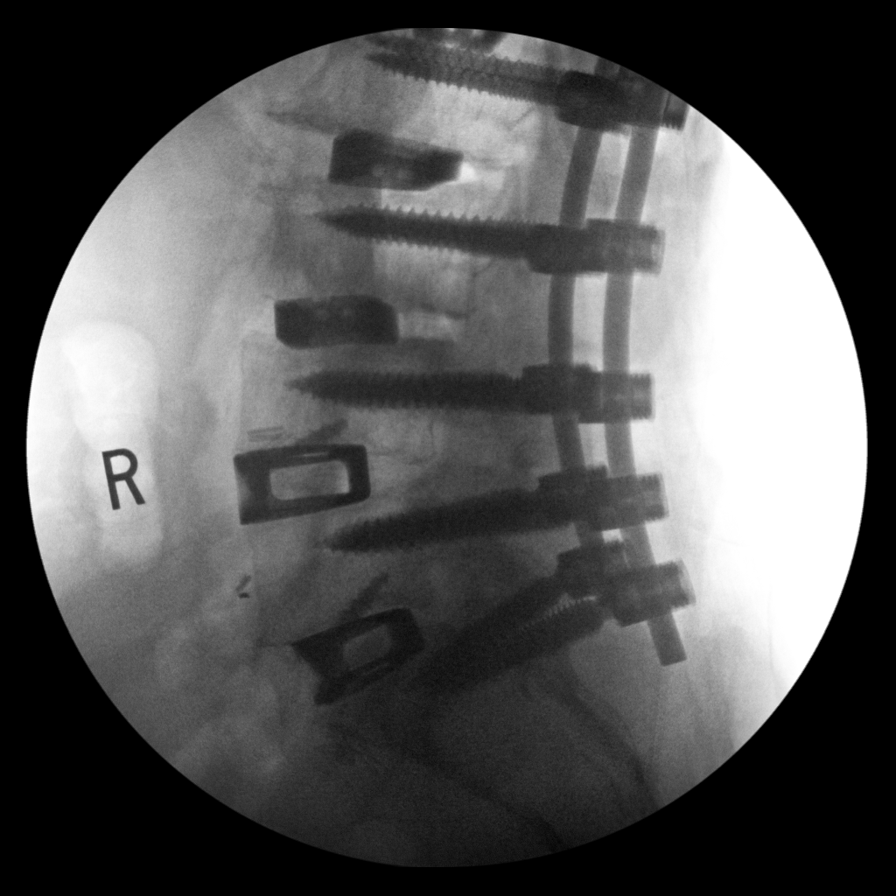
[im 5/7]
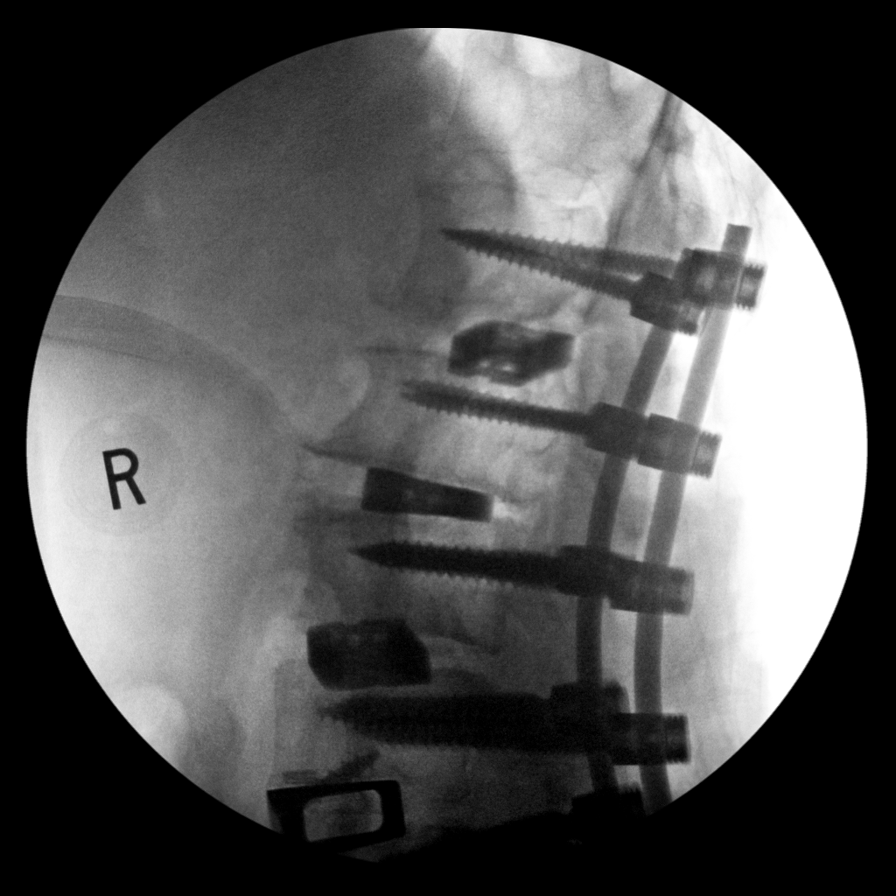
[im 6/7]
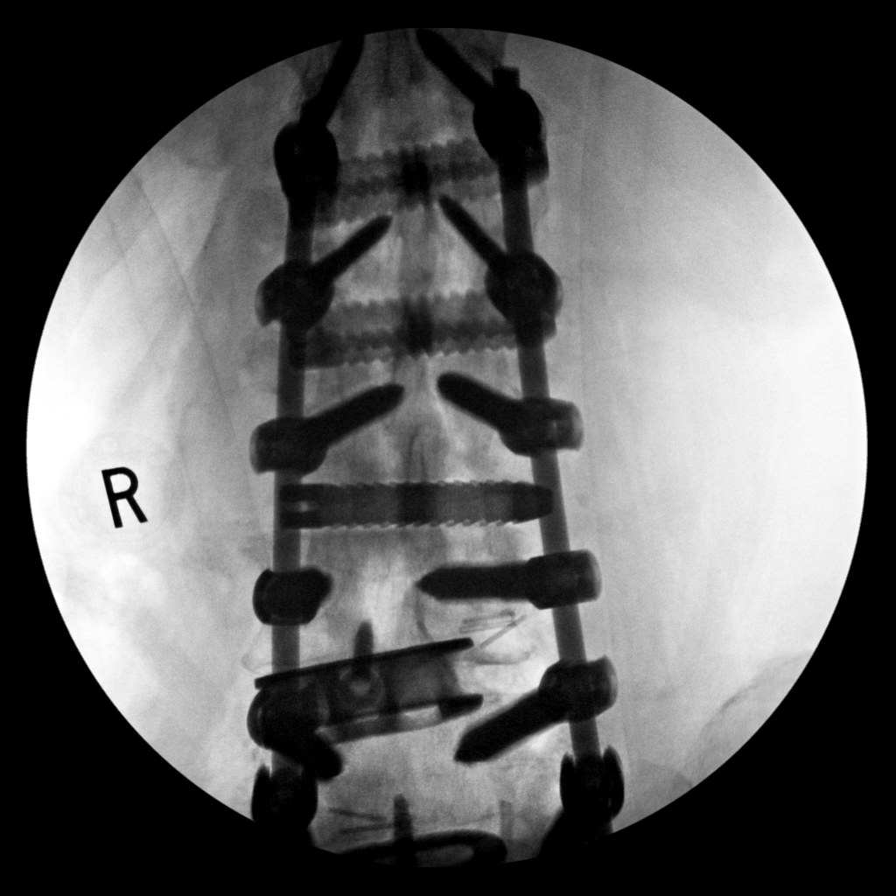
[im 7/7]
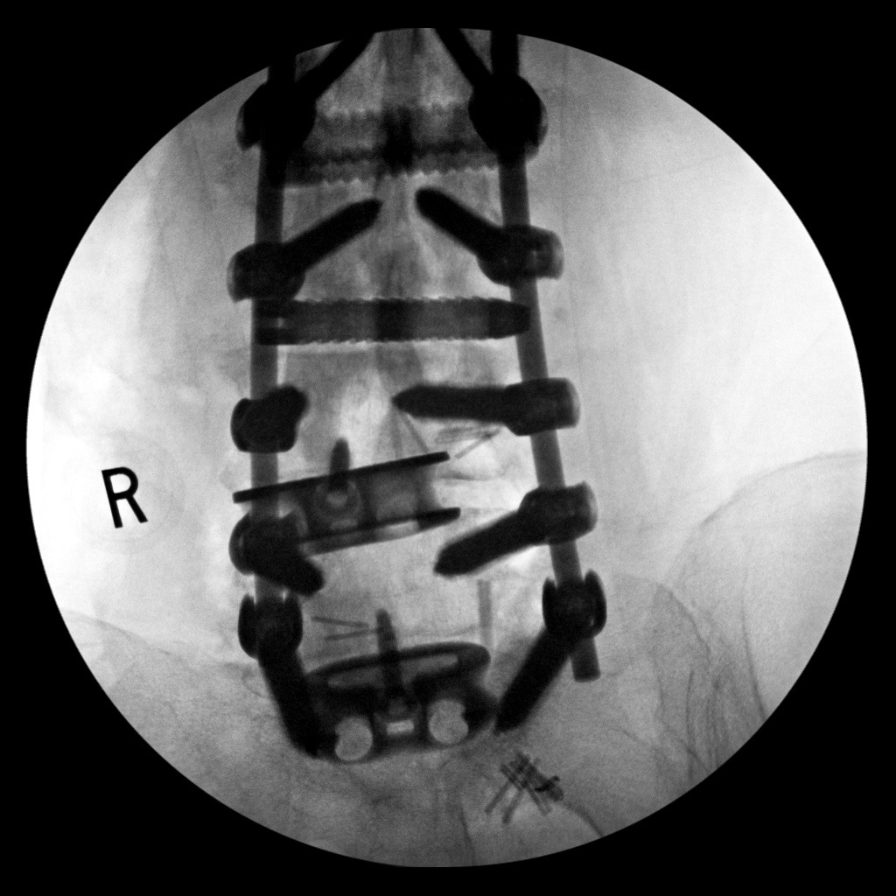

[7 of 7 positions shown; findings below may reference images not displayed]

FINDINGS: Intraoperative utilization of fluoroscopy for L1-L4 XLIF. Total
fluoroscopic time was 4 minutes and 21 seconds. Total fluoroscopic
dose was 97.74 mGy.
IMPRESSION: Intraoperative utilization of fluoroscopy
